# Patient Record
Sex: Female | Born: 1990 | Race: Black or African American | Hispanic: No | Marital: Single | Smoking: Never smoker
Health system: Southern US, Community
[De-identification: ages and names within clinical notes are randomized; demographics above are authoritative.]

## PROBLEM LIST (undated history)

## (undated) DIAGNOSIS — N159 Renal tubulo-interstitial disease, unspecified: Secondary | ICD-10-CM

## (undated) DIAGNOSIS — Z331 Pregnant state, incidental: Secondary | ICD-10-CM

## (undated) DIAGNOSIS — IMO0001 Reserved for inherently not codable concepts without codable children: Secondary | ICD-10-CM

## (undated) HISTORY — PX: OTHER SURGICAL HISTORY: SHX169

---
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
## 2011-05-27 ENCOUNTER — Encounter (HOSPITAL_COMMUNITY): Payer: Self-pay

## 2011-05-27 ENCOUNTER — Emergency Department (HOSPITAL_COMMUNITY): Payer: Medicaid Other

## 2011-05-27 ENCOUNTER — Emergency Department (HOSPITAL_COMMUNITY)
Admission: EM | Admit: 2011-05-27 | Discharge: 2011-05-27 | Disposition: A | Discharge status: Home / Self Care | Payer: Medicaid Other | Attending: Emergency Medicine | Admitting: Emergency Medicine

## 2011-05-27 DIAGNOSIS — O9989 Other specified diseases and conditions complicating pregnancy, childbirth and the puerperium: Secondary | ICD-10-CM | POA: Insufficient documentation

## 2011-05-27 DIAGNOSIS — R1011 Right upper quadrant pain: Secondary | ICD-10-CM | POA: Insufficient documentation

## 2011-05-27 HISTORY — DX: Reserved for inherently not codable concepts without codable children: IMO0001

## 2011-05-27 NOTE — ED Notes (Signed)
No new rx, pt voiced understanding to f/u with OB/GYN and return for any worsening abdominal pain/bleeding.  No c/o pain/vaginal bleeding at discharge
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
## 2011-05-27 NOTE — ED Notes (Signed)
Report called to [place], RN and pt moved to CDU 7.

## 2011-05-27 NOTE — ED Notes (Signed)
Pelvic exam has been completed.  Awaiting all results for pt disposition.
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
## 2011-05-27 NOTE — ED Notes (Signed)
[place] tech called to inform that she was here and ready for the pt.  Pt sent to [place].

## 2011-05-27 NOTE — ED Notes (Signed)
Pt transported to [place]
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
## 2011-05-27 NOTE — ED Notes (Addendum)
MD at bedside. 

## 2011-05-27 NOTE — ED Provider Notes (Signed)
Medical screening examination/treatment/procedure(s) were performed by non-physician practitioner and as supervising physician I was immediately available for consultation/collaboration.   Dayton Bailiff, MD 05/27/11 2149

## 2011-05-27 NOTE — ED Notes (Signed)
Pt returned from [place].  Family at bedside. No verbal complaints at this time.  Awaiting all diagnostic results for disposition.

## 2011-06-10 ENCOUNTER — Emergency Department (HOSPITAL_COMMUNITY)
Admission: EM | Admit: 2011-06-10 | Discharge: 2011-06-10 | Disposition: A | Discharge status: Home / Self Care | Payer: Medicaid Other | Attending: Emergency Medicine | Admitting: Emergency Medicine

## 2011-06-10 ENCOUNTER — Other Ambulatory Visit (HOSPITAL_COMMUNITY): Payer: Medicaid Other

## 2011-06-10 DIAGNOSIS — R1011 Right upper quadrant pain: Secondary | ICD-10-CM | POA: Insufficient documentation

## 2011-06-10 DIAGNOSIS — R112 Nausea with vomiting, unspecified: Secondary | ICD-10-CM | POA: Insufficient documentation

## 2011-06-10 DIAGNOSIS — R000 Tachycardia, unspecified: Secondary | ICD-10-CM | POA: Insufficient documentation

## 2011-06-10 DIAGNOSIS — M545 Low back pain, unspecified: Secondary | ICD-10-CM | POA: Insufficient documentation

## 2011-06-10 DIAGNOSIS — R10811 Right upper quadrant abdominal tenderness: Secondary | ICD-10-CM | POA: Insufficient documentation

## 2011-06-10 LAB — URINALYSIS, ROUTINE W REFLEX MICROSCOPIC
Bilirubin Urine: NEGATIVE
Glucose, UA: NEGATIVE mg/dL
Hgb urine dipstick: NEGATIVE
Leukocytes, UA: NEGATIVE
Nitrite: NEGATIVE
Protein, ur: NEGATIVE mg/dL
Specific Gravity, Urine: 1.015 (ref 1.005–1.030)
Urobilinogen, UA: 0.2 mg/dL (ref 0.0–1.0)
pH: 6 (ref 5.0–8.0)

## 2011-06-10 LAB — COMPREHENSIVE METABOLIC PANEL
ALT: 9 U/L (ref 0–35)
AST: 16 U/L (ref 0–37)
Albumin: 3.2 g/dL — ABNORMAL LOW (ref 3.5–5.2)
Alkaline Phosphatase: 93 U/L (ref 39–117)
BUN: 5 mg/dL — ABNORMAL LOW (ref 6–23)
CO2: 21 mEq/L (ref 19–32)
Calcium: 9.5 mg/dL (ref 8.4–10.5)
Chloride: 100 mEq/L (ref 96–112)
Creatinine, Ser: 0.51 mg/dL (ref 0.50–1.10)
GFR calc Af Amer: >90 mL/min (ref >90)
GFR calc non Af Amer: >90 mL/min (ref >90)
Glucose, Bld: 105 mg/dL — ABNORMAL HIGH (ref 70–99)
Potassium: 3.3 mEq/L — ABNORMAL LOW (ref 3.5–5.1)
Sodium: 132 mEq/L — ABNORMAL LOW (ref 135–145)
Total Bilirubin: 0.3 mg/dL (ref 0.3–1.2)
Total Protein: 7.2 g/dL (ref 6.0–8.3)

## 2011-06-10 LAB — DIFFERENTIAL
Basophils Absolute: 0 10*3/uL (ref 0.0–0.1)
Basophils Relative: 0 % (ref 0–1)
Eosinophils Absolute: 0 10*3/uL (ref 0.0–0.7)
Eosinophils Relative: 0 % (ref 0–5)
Lymphocytes Relative: 8 % — ABNORMAL LOW (ref 12–46)
Lymphs Abs: 0.8 10*3/uL (ref 0.7–4.0)
Monocytes Absolute: 0.4 10*3/uL (ref 0.1–1.0)
Monocytes Relative: 3 % (ref 3–12)
Neutro Abs: 9.3 10*3/uL — ABNORMAL HIGH (ref 1.7–7.7)
Neutrophils Relative %: 89 % — ABNORMAL HIGH (ref 43–77)

## 2011-06-10 LAB — CBC
HCT: 33.7 % — ABNORMAL LOW (ref 36.0–46.0)
Hemoglobin: 11.3 g/dL — ABNORMAL LOW (ref 12.0–15.0)
MCH: 28.3 pg (ref 26.0–34.0)
MCHC: 33.5 g/dL (ref 30.0–36.0)
MCV: 84.5 fL (ref 78.0–100.0)
Platelets: 218 10*3/uL (ref 150–400)
RBC: 3.99 MIL/uL (ref 3.87–5.11)
RDW: 13 % (ref 11.5–15.5)
WBC: 10.5 10*3/uL (ref 4.0–10.5)

## 2011-06-10 LAB — LIPASE, BLOOD: Lipase: 18 U/L (ref 11–59)

## 2011-06-10 MED ORDER — SODIUM CHLORIDE 0.9 % IV BOLUS (SEPSIS)
1000.0000 mL | INTRAVENOUS | Status: AC
  Administered 2011-06-10: 1000 mL via INTRAVENOUS

## 2011-06-10 MED ORDER — HYDROMORPHONE HCL PF 1 MG/ML IJ SOLN
1.0000 mg | Freq: Once | INTRAMUSCULAR | Status: AC
  Administered 2011-06-10: 1 mg via INTRAVENOUS
  Filled 2011-06-10: qty 1

## 2011-06-10 MED ORDER — ONDANSETRON HCL 4 MG/2ML IJ SOLN
4.0000 mg | Freq: Once | INTRAMUSCULAR | Status: AC
  Administered 2011-06-10: 4 mg via INTRAVENOUS
  Filled 2011-06-10: qty 2

## 2011-06-10 NOTE — ED Notes (Signed)
Pt states is [redacted] weeks pregnant, onset of lower back pain that radiates upward and into the right upper back x 2 days, denies injury

## 2011-06-10 NOTE — ED Provider Notes (Signed)
History     CSN: [id number]  Arrival date & time 06/10/11  0038   First MD Initiated Contact with Patient 06/10/11 0123      Chief Complaint  Patient presents with  . Back Pain    (Consider location/radiation/quality/duration/timing/severity/associated sxs/prior treatment) HPI Comments: Patient states that over the last 48 hours she developed a right upper quadrant and right back pain. She has been able to eating and drink but tonight started to develop nausea and vomiting which has been ongoing for the last 12 hours. Her pain is becoming worse, she has never had abdominal surgery, has never had evaluation for gallstones. She is [redacted] weeks pregnant with her second pregnancy. Her first pregnancy ended in miscarriage.  Patient is a 21 y.o. female presenting with back pain. The history is provided by the patient.  Back Pain  This is a new problem. The current episode started yesterday. The problem occurs constantly. The problem has been gradually worsening. The pain is associated with no known injury. Pain location: lower back bilaterally, right mid back and right upper quadrant. The quality of the pain is described as stabbing and aching. Radiates to: right abdomen. The pain is at a severity of 10/10. The pain is severe. Exacerbated by: palpation. The pain is the same all the time. Associated symptoms include abdominal pain. Pertinent negatives include no chest pain, no fever, no numbness, no headaches, no abdominal swelling, no bowel incontinence, no perianal numbness, no bladder incontinence, no dysuria, no pelvic pain, no leg pain, no paresthesias, no tingling and no weakness. She has tried nothing for the symptoms.    Past Medical History  Diagnosis Date  . No significant past medical history     Past Surgical History  Procedure Date  . Dnc     No family history on file.  History  Substance Use Topics  . Smoking status: Not on file  . Smokeless tobacco: Not on file  . Alcohol  Use:     OB History    Grav Para Term Preterm Abortions TAB SAB Ect Mult Living   1               Review of Systems  Constitutional: Negative for fever.  Cardiovascular: Negative for chest pain.  Gastrointestinal: Positive for abdominal pain. Negative for bowel incontinence.  Genitourinary: Negative for bladder incontinence, dysuria and pelvic pain.  Musculoskeletal: Positive for back pain.  Neurological: Negative for tingling, weakness, numbness, headaches and paresthesias.  All other systems reviewed and are negative.    Allergies  Review of patient's allergies indicates no known allergies.  Home Medications   Current Outpatient Rx  Name Route Sig Dispense Refill  . ACETAMINOPHEN 500 MG PO TABS Oral Take 500 mg by mouth every 6 (six) hours as needed. For pain    . ONDANSETRON HCL 8 MG PO TABS Oral Take 8 mg by mouth every 8 (eight) hours as needed. For nausea    . PRENATAL MULTIVITAMIN CH Oral Take 1 tablet by mouth daily.    Marland Kitchen RANITIDINE HCL 150 MG PO CAPS Oral Take 150 mg by mouth 2 (two) times daily.      BP 120/68  Pulse 128  Temp(Src) 98.6 F (37 C) (Oral)  Resp 20  Ht 5\' 4"  (1.626 m)  Wt 144 lb 8 oz (65.545 kg)  BMI 24.80 kg/m2  SpO2 100%  Physical Exam  Nursing note and vitals reviewed. Constitutional: She appears well-developed and well-nourished. No distress.  HENT:  Head:  Normocephalic and atraumatic.  Mouth/Throat: Oropharynx is clear and moist. No oropharyngeal exudate.  Eyes: Conjunctivae and EOM are normal. Pupils are equal, round, and reactive to light. Right eye exhibits no discharge. Left eye exhibits no discharge. No scleral icterus.  Neck: Normal range of motion. Neck supple. No JVD present. No thyromegaly present.  Cardiovascular: Regular rhythm, normal heart sounds and intact distal pulses.  Exam reveals no gallop and no friction rub.   No murmur heard.      tachycardia  Pulmonary/Chest: Effort normal and breath sounds normal. No  respiratory distress. She has no wheezes. She has no rales.  Abdominal: Soft. Bowel sounds are normal. She exhibits no distension and no mass. There is tenderness ( right upper quadrant tenderness to palpation, no other abdominal tenderness, non-peritoneal, soft and no guarding).  Musculoskeletal: Normal range of motion. She exhibits tenderness ( mild tenderness in the lower back and right mid back). She exhibits no edema.  Lymphadenopathy:    She has no cervical adenopathy.  Neurological: She is alert. Coordination normal.  Skin: Skin is warm and dry. No rash noted. No erythema.  Psychiatric: She has a normal mood and affect. Her behavior is normal.    ED Course  Procedures (including critical care time)  Labs Reviewed  URINALYSIS, ROUTINE W REFLEX MICROSCOPIC - Abnormal; Notable for the following:    Ketones, ur TRACE (*)    All other components within normal limits  CBC - Abnormal; Notable for the following:    Hemoglobin 11.3 (*)    HCT 33.7 (*)    All other components within normal limits  DIFFERENTIAL - Abnormal; Notable for the following:    Neutrophils Relative 89 (*)    Neutro Abs 9.3 (*)    Lymphocytes Relative 8 (*)    All other components within normal limits  COMPREHENSIVE METABOLIC PANEL - Abnormal; Notable for the following:    Sodium 132 (*)    Potassium 3.3 (*)    Glucose, Bld 105 (*)    BUN 5 (*)    Albumin 3.2 (*)    All other components within normal limits  LIPASE, BLOOD  URINE CULTURE   No results found.   1. Abdominal pain       MDM  Exam consistent with a possible etiology including gallbladder disease, pyelonephritis or urinary source, pancreatitis. Laboratory evaluation to ensue, IV fluids, labs, parenteral medications.  Pt reexamined and has no abdominal tenderness on repeat exam. She states that the hydromorphone improved her pain completely.    Review of laboratory studies shows slight hyponatremia and hypokalemia, normal renal function,  normal liver function, normal blood counts and urinalysis clean of infection.  Patient informed of results and encouraged to followup in the morning for ultrasound which is scheduled for her.       Vida Roller, MD 06/10/11 [phone]

## 2011-06-10 NOTE — ED Notes (Signed)
Pt was advised to come for an outpatient abdominal ultrasound for lower right quadrant pain by 1200 pm February 10,2013. Pt did not show or call and cancel ultrasound.

## 2011-06-11 LAB — URINE CULTURE
Colony Count: 1000
Culture  Setup Time: 201302102101

## 2011-06-29 NOTE — ED Provider Notes (Signed)
Medical screening examination/treatment/procedure(s) were performed by non-physician practitioner and as supervising physician I was immediately available for consultation/collaboration.   Dayton Bailiff, MD 06/29/11 1500

## 2011-08-06 ENCOUNTER — Encounter (HOSPITAL_COMMUNITY): Payer: Self-pay | Admitting: *Deleted

## 2011-08-06 ENCOUNTER — Emergency Department (HOSPITAL_COMMUNITY)
Admission: EM | Admit: 2011-08-06 | Discharge: 2011-08-06 | Disposition: A | Discharge status: Home / Self Care | Payer: Medicaid Other | Attending: Emergency Medicine | Admitting: Emergency Medicine

## 2011-08-06 DIAGNOSIS — R1013 Epigastric pain: Secondary | ICD-10-CM | POA: Insufficient documentation

## 2011-08-06 DIAGNOSIS — R10816 Epigastric abdominal tenderness: Secondary | ICD-10-CM | POA: Insufficient documentation

## 2011-08-06 DIAGNOSIS — M549 Dorsalgia, unspecified: Secondary | ICD-10-CM | POA: Insufficient documentation

## 2011-08-06 DIAGNOSIS — Z331 Pregnant state, incidental: Secondary | ICD-10-CM | POA: Insufficient documentation

## 2011-08-06 DIAGNOSIS — E871 Hypo-osmolality and hyponatremia: Secondary | ICD-10-CM | POA: Insufficient documentation

## 2011-08-06 HISTORY — DX: Pregnant state, incidental: Z33.1

## 2011-08-06 LAB — COMPREHENSIVE METABOLIC PANEL
ALT: 13 U/L (ref 0–35)
AST: 20 U/L (ref 0–37)
Albumin: 3.4 g/dL — ABNORMAL LOW (ref 3.5–5.2)
Alkaline Phosphatase: 109 U/L (ref 39–117)
BUN: 9 mg/dL (ref 6–23)
CO2: 19 mEq/L (ref 19–32)
Calcium: 10.2 mg/dL (ref 8.4–10.5)
Chloride: 101 mEq/L (ref 96–112)
Creatinine, Ser: 0.51 mg/dL (ref 0.50–1.10)
GFR calc Af Amer: >90 mL/min (ref >90)
GFR calc non Af Amer: >90 mL/min (ref >90)
Glucose, Bld: 75 mg/dL (ref 70–99)
Potassium: 3.8 mEq/L (ref 3.5–5.1)
Sodium: 133 mEq/L — ABNORMAL LOW (ref 135–145)
Total Bilirubin: 0.1 mg/dL — ABNORMAL LOW (ref 0.3–1.2)
Total Protein: 7.5 g/dL (ref 6.0–8.3)

## 2011-08-06 LAB — DIFFERENTIAL
Basophils Absolute: 0 10*3/uL (ref 0.0–0.1)
Basophils Relative: 0 % (ref 0–1)
Eosinophils Absolute: 0.1 10*3/uL (ref 0.0–0.7)
Eosinophils Relative: 1 % (ref 0–5)
Lymphocytes Relative: 25 % (ref 12–46)
Lymphs Abs: 2.9 10*3/uL (ref 0.7–4.0)
Monocytes Absolute: 0.7 10*3/uL (ref 0.1–1.0)
Monocytes Relative: 6 % (ref 3–12)
Neutro Abs: 7.9 10*3/uL — ABNORMAL HIGH (ref 1.7–7.7)
Neutrophils Relative %: 68 % (ref 43–77)

## 2011-08-06 LAB — URINALYSIS, ROUTINE W REFLEX MICROSCOPIC
Bilirubin Urine: NEGATIVE
Glucose, UA: NEGATIVE mg/dL
Hgb urine dipstick: NEGATIVE
Ketones, ur: NEGATIVE mg/dL
Leukocytes, UA: NEGATIVE
Nitrite: NEGATIVE
Protein, ur: NEGATIVE mg/dL
Specific Gravity, Urine: >1.03 — ABNORMAL HIGH (ref 1.005–1.030)
Urobilinogen, UA: 0.2 mg/dL (ref 0.0–1.0)
pH: 5.5 (ref 5.0–8.0)

## 2011-08-06 LAB — CBC
HCT: 34.6 % — ABNORMAL LOW (ref 36.0–46.0)
Hemoglobin: 11.5 g/dL — ABNORMAL LOW (ref 12.0–15.0)
MCH: 29 pg (ref 26.0–34.0)
MCHC: 33.2 g/dL (ref 30.0–36.0)
MCV: 87.2 fL (ref 78.0–100.0)
Platelets: 278 10*3/uL (ref 150–400)
RBC: 3.97 MIL/uL (ref 3.87–5.11)
RDW: 13.6 % (ref 11.5–15.5)
WBC: 11.6 10*3/uL — ABNORMAL HIGH (ref 4.0–10.5)

## 2011-08-06 LAB — LIPASE, BLOOD: Lipase: 28 U/L (ref 11–59)

## 2011-08-06 MED ORDER — ACETAMINOPHEN 500 MG PO TABS
1000.0000 mg | ORAL_TABLET | Freq: Once | ORAL | Status: AC
  Administered 2011-08-06: 1000 mg via ORAL
  Filled 2011-08-06: qty 2

## 2011-08-06 MED ORDER — ALUM & MAG HYDROXIDE-SIMETH 200-200-20 MG/5ML PO SUSP
30.0000 mL | Freq: Once | ORAL | Status: AC
  Administered 2011-08-06: 30 mL via ORAL
  Filled 2011-08-06: qty 30

## 2011-08-06 MED ORDER — SODIUM CHLORIDE 0.9 % IV SOLN: INTRAVENOUS | Status: DC

## 2011-08-06 MED ORDER — SODIUM CHLORIDE 0.9 % IV BOLUS (SEPSIS)
1000.0000 mL | Freq: Once | INTRAVENOUS | Status: AC
  Administered 2011-08-06: 1000 mL via INTRAVENOUS

## 2011-08-06 NOTE — ED Provider Notes (Signed)
History  This chart was scribed for Ward Givens, MD by Bennett Scrape and Cherlynn Perches. This patient was seen in room APA01/APA01 and the patient's care was started at 7:05PM.  CSN: [id number]  Arrival date & time 08/06/11  1705   First MD Initiated Contact with Patient 08/06/11 1857      Chief Complaint  Patient presents with  . Back Pain     Patient is a 21 y.o. female presenting with back pain. The history is provided by the patient. No language interpreter was used.  Back Pain  This is a new problem. The current episode started 3 to 5 hours ago. The problem occurs constantly. The problem has not changed since onset.The pain is associated with no known injury. The pain is present in the lumbar spine. The quality of the pain is described as aching. The pain does not radiate. The pain is mild. The symptoms are aggravated by certain positions. The pain is the same all the time. Associated symptoms include abdominal pain (mild epigastric pain). Pertinent negatives include no chest pain and no dysuria. She has tried nothing for the symptoms. Risk factors include pregnancy.    Samantha Singleton is a 21 y.o. female who presents to the Emergency Department complaining of constant back pain diffusely throughout the lumbar region onset 3 hours. The pain is aching in quality and non-radiating. Pt initially said nothing made the pain worse including changing positions then states that moving in certain positions makes the pain worse and that nothing makes the pain better. Denies any change in activity or injury as the cause of the pain. Pt is pregnant and reports that her last normal menstrual period was February 11, 2011. She is followed by doctors at [facility] in [place] and is scheduled to give birth November 18, 2011 at [facility] Pt is [id number]. She states she is 0+. Pt has had previous complaints of back pain, last evaluation being in February but that was a sharp sticking pain.  Pt states she was not diagnosed with any condition at that time. Pt reports that she had emesis early in her pregnancy and has been taking Zofran and Zantac for relief.  Denies spotting, nausea, vomiting, and diarrhea as associated symptoms. Pt denies smoking and alcohol use.  OB Dr Ralph Dowdy and [facility].    Past Medical History  Diagnosis Date  . No significant past medical history   . Pregnant state, incidental     Past Surgical History  Procedure Date  . Dnc     History reviewed. No pertinent family history.  History  Substance Use Topics  . Smoking status: Never Smoker   . Smokeless tobacco: Not on file  . Alcohol Use: No  employed  OB History    Grav Para Term Preterm Abortions TAB SAB Ect Mult Living   1               Review of Systems  Respiratory: Negative for shortness of breath.   Cardiovascular: Negative for chest pain.  Gastrointestinal: Positive for abdominal pain (mild epigastric pain). Negative for nausea, vomiting and diarrhea.  Genitourinary: Negative for dysuria.  Musculoskeletal: Positive for back pain.  All other systems reviewed and are negative.    Allergies  Review of patient's allergies indicates no known allergies.  Home Medications   Current Outpatient Rx  Name Route Sig Dispense Refill  . ACETAMINOPHEN 500 MG PO TABS Oral Take 500 mg by mouth every 6 (six) hours  as needed. For pain    . ONDANSETRON HCL 8 MG PO TABS Oral Take 8 mg by mouth every 8 (eight) hours as needed. For nausea    . PRENATAL MULTIVITAMIN CH Oral Take 1 tablet by mouth daily.    Marland Kitchen RANITIDINE HCL 150 MG PO CAPS Oral Take 150 mg by mouth 2 (two) times daily.      Triage Vitals: BP 115/71  Pulse 132  Temp(Src) 98 F (36.7 C) (Oral)  Resp 22  Wt 150 lb (68.04 kg)  SpO2 100%  LMP 02/11/2011  Vital signs normal except tachycardia   Physical Exam  Nursing note and vitals reviewed. Constitutional: She is oriented to person, place, and time. She  appears well-developed and well-nourished. She appears distressed.  HENT:  Head: Normocephalic and atraumatic.  Right Ear: External ear normal.  Left Ear: External ear normal.       Dry mucous membranes  Eyes: Conjunctivae and EOM are normal. Pupils are equal, round, and reactive to light.  Neck: Normal range of motion. Neck supple.  Cardiovascular: Normal rate, regular rhythm and normal heart sounds.   Pulmonary/Chest: Effort normal and breath sounds normal.  Abdominal: Soft. She exhibits no distension. There is tenderness. There is no rebound and no guarding.       Abdomen is consistent with dates. Patient has some mild epigastric tenderness without guarding or rebound. She also has some minimal discomfort in the left upper and right upper quadrant also.  Genitourinary:       Patient has normal external genitalia. There is no blood in her vault. Nitrazine test was negative. Her cervix feels soft but is not elongated. Her cervix is closed.  Musculoskeletal: Normal range of motion. She exhibits no edema and no tenderness.       Patient has diffuse tenderness across her back cleaned both flanks.  Neurological: She is alert and oriented to person, place, and time.  Skin: Skin is warm and dry.  Psychiatric: She has a normal mood and affect. Her behavior is normal.    ED Course  Procedures (including critical care time)  7:20PM - Treatment plan discussed with patient and she agrees.  [facility] has reviewed her tocometer strips and states they do not see any evidence of contractions, FHR has been fine. Monitor shows HR 120-140 with variability  20:40Dr Kent Hjerpe states she is 25 weeks, 1day, given lab and monitor results. States to given antacid. States to come to the office in the am, states to have her take tylenol (has had +THC on their tests).  DIAGNOSTIC STUDIES: Oxygen Saturation is 100% on room air, normal by my interpretation.    COORDINATION OF CARE:  Results for  orders placed during the hospital encounter of 08/06/11  URINALYSIS, ROUTINE W REFLEX MICROSCOPIC      Component Value Range   Color, Urine YELLOW  YELLOW    APPearance CLEAR  CLEAR    Specific Gravity, Urine >1.030 (*) 1.005 - 1.030    pH 5.5  5.0 - 8.0    Glucose, UA NEGATIVE  NEGATIVE (mg/dL)   Hgb urine dipstick NEGATIVE  NEGATIVE    Bilirubin Urine NEGATIVE  NEGATIVE    Ketones, ur NEGATIVE  NEGATIVE (mg/dL)   Protein, ur NEGATIVE  NEGATIVE (mg/dL)   Urobilinogen, UA 0.2  0.0 - 1.0 (mg/dL)   Nitrite NEGATIVE  NEGATIVE    Leukocytes, UA NEGATIVE  NEGATIVE   CBC      Component Value Range   WBC  11.6 (*) 4.0 - 10.5 (K/uL)   RBC 3.97  3.87 - 5.11 (MIL/uL)   Hemoglobin 11.5 (*) 12.0 - 15.0 (g/dL)   HCT 40.9 (*) 81.1 - 46.0 (%)   MCV 87.2  78.0 - 100.0 (fL)   MCH 29.0  26.0 - 34.0 (pg)   MCHC 33.2  30.0 - 36.0 (g/dL)   RDW 91.4  78.2 - 95.6 (%)   Platelets 278  150 - 400 (K/uL)  DIFFERENTIAL      Component Value Range   Neutrophils Relative 68  43 - 77 (%)   Neutro Abs 7.9 (*) 1.7 - 7.7 (K/uL)   Lymphocytes Relative 25  12 - 46 (%)   Lymphs Abs 2.9  0.7 - 4.0 (K/uL)   Monocytes Relative 6  3 - 12 (%)   Monocytes Absolute 0.7  0.1 - 1.0 (K/uL)   Eosinophils Relative 1  0 - 5 (%)   Eosinophils Absolute 0.1  0.0 - 0.7 (K/uL)   Basophils Relative 0  0 - 1 (%)   Basophils Absolute 0.0  0.0 - 0.1 (K/uL)  COMPREHENSIVE METABOLIC PANEL      Component Value Range   Sodium 133 (*) 135 - 145 (mEq/L)   Potassium 3.8  3.5 - 5.1 (mEq/L)   Chloride 101  96 - 112 (mEq/L)   CO2 19  19 - 32 (mEq/L)   Glucose, Bld 75  70 - 99 (mg/dL)   BUN 9  6 - 23 (mg/dL)   Creatinine, Ser 2.13  0.50 - 1.10 (mg/dL)   Calcium 08.6  8.4 - 10.5 (mg/dL)   Total Protein 7.5  6.0 - 8.3 (g/dL)   Albumin 3.4 (*) 3.5 - 5.2 (g/dL)   AST 20  0 - 37 (U/L)   ALT 13  0 - 35 (U/L)   Alkaline Phosphatase 109  39 - 117 (U/L)   Total Bilirubin 0.1 (*) 0.3 - 1.2 (mg/dL)   GFR calc non Af Amer >90  >90 (mL/min)    GFR calc Af Amer >90  >90 (mL/min)  LIPASE, BLOOD      Component Value Range   Lipase 28  11 - 59 (U/L)   Laboratory interpretation all normal except concentrated urine, mild hyponatremia .    1. Back pain   2. Pregnancy as incidental finding     Plan discharge Devoria Albe, MD, FACEP   MDM     I personally performed the services described in this documentation, which was scribed in my presence. The recorded information has been reviewed and considered. Devoria Albe, MD, Armando Gang      Ward Givens, MD 08/06/11 2053

## 2011-08-06 NOTE — ED Notes (Addendum)
Low back pain , no vag bleeding,  Onset 1 hour pta. Pt has been feeling baby move.No abd pain

## 2011-08-06 NOTE — ED Notes (Signed)
MD notified that patient could be removed from [facility] after MD order, per Olegario Messier at [facility].  30 minute strip has been obtained.

## 2011-08-06 NOTE — Discharge Instructions (Signed)
Your monitor tonight does not show any evidence of you going into labor. Your babies heartbeat was normal Your blood tests for possible gallstones were normal. I spoke to your obstetrician who was on call tonight and he wants you to take  Tylenol for pain. He also wants you to come to the office in the morning to be  reseen by them.  Back Pain in Pregnancy Back pain during pregnancy is common. It happens in about half of all pregnancies. It is important for you and your baby that you remain active during your pregnancy.If you feel that back pain is not allowing you to remain active or sleep well, it is time to see your caregiver. Back pain may be caused by several factors related to changes during your pregnancy.Fortunately, unless you had trouble with your back before your pregnancy, the pain is likely to get better after you deliver. Low back pain usually occurs between the fifth and seventh months of pregnancy. It can, however, happen in the first couple months. Factors that increase the risk of back problems include:   Previous back problems.   Injury to your back.   Having twins or multiple births.   A chronic cough.   Stress.   Job-related repetitive motions.   Muscle or spinal disease in the back.   Family history of back problems, ruptured (herniated) discs, or osteoporosis.   Depression, anxiety, and panic attacks.  CAUSES   When you are pregnant, your body produces a hormone called relaxin. This hormonemakes the ligaments connecting the low back and pubic bones more flexible. This flexibility allows the baby to be delivered more easily. When your ligaments are loose, your muscles need to work harder to support your back. Soreness in your back can come from tired muscles. Soreness can also come from back tissues that are irritated since they are receiving less support.   As the baby grows, it puts pressure on the nerves and blood vessels in your pelvis. This can cause back  pain.   As the baby grows and gets heavier during pregnancy, the uterus pushes the stomach muscles forward and changes your center of gravity. This makes your back muscles work harder to maintain good posture.  SYMPTOMS  Lumbar pain during pregnancy Lumbar pain during pregnancy usually occurs at or above the waist in the center of the back. There may be pain and numbness that radiates into your leg or foot. This is similar to low back pain experienced by non-pregnant women. It usually increases with sitting for long periods of time, standing, or repetitive lifting. Tenderness may also be present in the muscles along your upper back. Posterior pelvic pain during pregnancy Pain in the back of the pelvis is more common than lumbar pain in pregnancy. It is a deep pain felt in your side at the waistline, or across the tailbone (sacrum), or in both places. You may have pain on one or both sides. This pain can also go into the buttocks and backs of the upper thighs. Pubic and groin pain may also be present. The pain does not quickly resolve with rest, and morning stiffness may also be present. Pelvic pain during pregnancy can be brought on by most activities. A high level of fitness before and during pregnancy may or may not prevent this problem. Labor pain is usually 1 to 2 minutes apart, lasts for about 1 minute, and involves a bearing down feeling or pressure in your pelvis. However, if you are at term  with the pregnancy, constant low back pain can be the beginning of early labor, and you should be aware of this. DIAGNOSIS  X-rays of the back should not be done during the first 12 to 14 weeks of the pregnancy and only when absolutely necessary during the rest of the pregnancy. MRIs do not give off radiation and are safe during pregnancy. MRIs also should only be done when absolutely necessary. HOME CARE INSTRUCTIONS  Exercise as directed by your caregiver. Exercise is the most effective way to prevent or  manage back pain. If you have a back problem, it is especially important to avoid sports that require sudden body movements. Swimming and walking are great activities.   Do not stand in one place for long periods of time.   Do not wear high heels.   Sit in chairs with good posture. Use a pillow on your lower back if necessary. Make sure your head rests over your shoulders and is not hanging forward.   Try sleeping on your side, preferably the left side, with a pillow or two between your legs. If you are sore after a night's rest, your bedmay betoo soft.Try placing a board between your mattress and box spring.   Listen to your body when lifting.If you are experiencing pain, ask for help or try bending yourknees more so you can use your leg muscles rather than your back muscles. Squat down when picking up something from the floor. Do not bend over.   Eat a healthy diet. Try to gain weight within your caregiver's recommendations.   Use heat or cold packs 3 to 4 times a day for 15 minutes to help with the pain.   Only take over-the-counter or prescription medicines for pain, discomfort, or fever as directed by your caregiver.  Sudden (acute) back pain  Use bed rest for only the most extreme, acute episodes of back pain. Prolonged bed rest over 48 hours will aggravate your condition.   Ice is very effective for acute conditions.   Put ice in a plastic bag.   Place a towel between your skin and the bag.   Leave the ice on for 10 to 20 minutes every 2 hours, or as needed.   Using heat packs for 30 minutes prior to activities is also helpful.  Continued back pain See your caregiver if you have continued problems. Your caregiver can help or refer you for appropriate physical therapy. With conditioning, most back problems can be avoided. Sometimes, a more serious issue may be the cause of back pain. You should be seen right away if new problems seem to be developing. Your caregiver may  recommend:  A maternity girdle.   An elastic sling.   A back brace.   A massage therapist or acupuncture.  SEEK MEDICAL CARE IF:   You are not able to do most of your daily activities, even when taking the pain medicine you were given.   You need a referral to a physical therapist or chiropractor.   You want to try acupuncture.  SEEK IMMEDIATE MEDICAL CARE IF:  You develop numbness, tingling, weakness, or problems with the use of your arms or legs.   You develop severe back pain that is no longer relieved with medicines.   You have a sudden change in bowel or bladder control.   You have increasing pain in other areas of the body.   You develop shortness of breath, dizziness, or fainting.   You develop nausea, vomiting, or  sweating.   You have back pain which is similar to labor pains.   You have back pain along with your water breaking or vaginal bleeding.   You have back pain or numbness that travels down your leg.   Your back pain developed after you fell.   You develop pain on one side of your back. You may have a kidney stone.   You see blood in your urine. You may have a bladder infection or kidney stone.   You have back pain with blisters. You may have shingles.  Back pain is fairly common during pregnancy but should not be accepted as just part of the process. Back pain should always be treated as soon as possible. This will make your pregnancy as pleasant as possible. Document Released: 07/25/2005 Document Revised: 04/05/2011 Document Reviewed: 09/05/2010 [facility] Patient Information 2012 [place], [place].

## 2011-08-06 NOTE — Progress Notes (Signed)
Spoke with Samantha Singleton at length regarding the EFM tracing poorly. Explained that sometimes it is necessary to hold monitor on in order to obtain a 20 minute tracing.  Samantha Singleton explained that no one is available to provide this level of care at this time.  Samantha Singleton states she will speak with ED physician regarding this situation.  Will continue to attempt to monitor remotely.

## 2011-08-06 NOTE — ED Notes (Signed)
Spoke with joy with rapid response. Pt placed on toco

## 2011-08-06 NOTE — Progress Notes (Signed)
Notified ED RN of reactive tracing (120 bpm, 10x10 accels, no decels) and no contractions noted.  Will continue to assess monitoring while patient is on fetal monitoring

## 2013-04-16 ENCOUNTER — Encounter (HOSPITAL_COMMUNITY): Payer: Self-pay | Admitting: Emergency Medicine

## 2013-04-16 ENCOUNTER — Emergency Department (HOSPITAL_COMMUNITY)
Admission: EM | Admit: 2013-04-16 | Discharge: 2013-04-16 | Disposition: A | Discharge status: Home / Self Care | Payer: Medicaid Other | Attending: Emergency Medicine | Admitting: Emergency Medicine

## 2013-04-16 ENCOUNTER — Emergency Department (HOSPITAL_COMMUNITY): Payer: Medicaid Other

## 2013-04-16 DIAGNOSIS — Z87448 Personal history of other diseases of urinary system: Secondary | ICD-10-CM | POA: Insufficient documentation

## 2013-04-16 DIAGNOSIS — R112 Nausea with vomiting, unspecified: Secondary | ICD-10-CM | POA: Insufficient documentation

## 2013-04-16 DIAGNOSIS — F0781 Postconcussional syndrome: Secondary | ICD-10-CM

## 2013-04-16 HISTORY — DX: Renal tubulo-interstitial disease, unspecified: N15.9

## 2013-04-16 MED ORDER — OXYCODONE-ACETAMINOPHEN 5-325 MG PO TABS: 2.0000 | ORAL_TABLET | ORAL | Status: DC | PRN
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
## 2014-03-01 ENCOUNTER — Encounter (HOSPITAL_COMMUNITY): Payer: Self-pay | Admitting: Emergency Medicine

## 2016-08-15 ENCOUNTER — Emergency Department (HOSPITAL_COMMUNITY): Payer: No Typology Code available for payment source

## 2016-08-15 ENCOUNTER — Encounter (HOSPITAL_COMMUNITY): Payer: Self-pay | Admitting: *Deleted

## 2016-08-15 ENCOUNTER — Emergency Department (HOSPITAL_COMMUNITY)
Admission: EM | Admit: 2016-08-15 | Discharge: 2016-08-15 | Disposition: A | Discharge status: Home / Self Care | Payer: No Typology Code available for payment source | Attending: Emergency Medicine | Admitting: Emergency Medicine

## 2016-08-15 DIAGNOSIS — T148XXA Other injury of unspecified body region, initial encounter: Secondary | ICD-10-CM

## 2016-08-15 DIAGNOSIS — S0003XA Contusion of scalp, initial encounter: Secondary | ICD-10-CM | POA: Diagnosis not present

## 2016-08-15 DIAGNOSIS — Y92410 Unspecified street and highway as the place of occurrence of the external cause: Secondary | ICD-10-CM | POA: Insufficient documentation

## 2016-08-15 DIAGNOSIS — Y9389 Activity, other specified: Secondary | ICD-10-CM | POA: Diagnosis not present

## 2016-08-15 DIAGNOSIS — S161XXA Strain of muscle, fascia and tendon at neck level, initial encounter: Secondary | ICD-10-CM | POA: Diagnosis not present

## 2016-08-15 DIAGNOSIS — S0990XA Unspecified injury of head, initial encounter: Secondary | ICD-10-CM | POA: Diagnosis present

## 2016-08-15 DIAGNOSIS — S39012A Strain of muscle, fascia and tendon of lower back, initial encounter: Secondary | ICD-10-CM | POA: Insufficient documentation

## 2016-08-15 DIAGNOSIS — Y999 Unspecified external cause status: Secondary | ICD-10-CM | POA: Diagnosis not present

## 2016-08-15 LAB — I-STAT BETA HCG BLOOD, ED (MC, WL, AP ONLY)

## 2016-08-15 MED ORDER — CYCLOBENZAPRINE HCL 10 MG PO TABS: 10.0000 mg | ORAL_TABLET | Freq: Two times a day (BID) | ORAL | 0 refills | Status: DC | PRN

## 2016-08-15 MED ORDER — CYCLOBENZAPRINE HCL 10 MG PO TABS
10.0000 mg | ORAL_TABLET | Freq: Once | ORAL | Status: AC
  Administered 2016-08-15: 10 mg via ORAL
  Filled 2016-08-15: qty 1

## 2016-08-15 MED ORDER — IBUPROFEN 800 MG PO TABS: 800.0000 mg | ORAL_TABLET | Freq: Three times a day (TID) | ORAL | 0 refills | Status: DC

## 2016-08-15 MED ORDER — KETOROLAC TROMETHAMINE 30 MG/ML IJ SOLN
30.0000 mg | Freq: Once | INTRAMUSCULAR | Status: AC
  Administered 2016-08-15: 30 mg via INTRAVENOUS
  Filled 2016-08-15: qty 1
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
## 2016-08-15 NOTE — Discharge Instructions (Signed)
You will feel sore for the next few days.   Take ibuprofen as directed for pain.   Take Flexeril as directed for pain. Do not drive while taking this medication because it will make you drowsy.   Follow-up with one of the clinics listed below for follow-up in 24-48 hours.   Return to the Emergency Dept for any worsening pain, weakness of your arms or legs, high fevers, or any other worsening or concerning symptoms.
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
## 2019-10-02 ENCOUNTER — Emergency Department (HOSPITAL_COMMUNITY)
Admission: EM | Admit: 2019-10-02 | Discharge: 2019-10-02 | Disposition: A | Discharge status: Home / Self Care | Payer: Self-pay | Attending: Emergency Medicine | Admitting: Emergency Medicine

## 2019-10-02 ENCOUNTER — Other Ambulatory Visit: Payer: Self-pay

## 2019-10-02 ENCOUNTER — Encounter (HOSPITAL_COMMUNITY): Payer: Self-pay | Admitting: *Deleted

## 2019-10-02 DIAGNOSIS — L02214 Cutaneous abscess of groin: Secondary | ICD-10-CM | POA: Insufficient documentation

## 2019-10-02 MED ORDER — OXYCODONE-ACETAMINOPHEN 5-325 MG PO TABS
1.0000 | ORAL_TABLET | Freq: Once | ORAL | Status: AC
  Administered 2019-10-02: 1 via ORAL
  Filled 2019-10-02: qty 1

## 2019-10-02 MED ORDER — HYDROCODONE-ACETAMINOPHEN 5-325 MG PO TABS: ORAL_TABLET | ORAL | 0 refills | Status: DC

## 2019-10-02 MED ORDER — LIDOCAINE HCL (PF) 2 % IJ SOLN
INTRAMUSCULAR | Status: AC
  Filled 2019-10-02: qty 10

## 2019-10-02 MED ORDER — LIDOCAINE HCL (PF) 2 % IJ SOLN
5.0000 mL | Freq: Once | INTRAMUSCULAR | Status: AC
  Administered 2019-10-02: 5 mL via INTRADERMAL

## 2019-10-02 MED ORDER — POVIDONE-IODINE 10 % EX SOLN
CUTANEOUS | Status: DC | PRN
  Administered 2019-10-02: 1 via TOPICAL
  Filled 2019-10-02: qty 15

## 2019-10-02 MED ORDER — CLINDAMYCIN HCL 150 MG PO CAPS: 300.0000 mg | ORAL_CAPSULE | Freq: Four times a day (QID) | ORAL | 0 refills | Status: DC

## 2019-10-02 NOTE — Discharge Instructions (Signed)
Apply warm wet compresses or warm water soaks to the affected area 2-3 times a day.  Take the antibiotic as directed until its finished.  The packing may be removed in 2 days.  Keep the area bandaged.  Contact the general surgeon listed to arrange a follow-up appointment.

## 2019-10-02 NOTE — ED Provider Notes (Signed)
[facility] EMERGENCY DEPARTMENT Provider Note   CSN: [id number] Arrival date & time: 10/02/19  1139     History Chief Complaint  Patient presents with  . Abscess    Samantha Singleton is a 29 y.o. female.  HPI     Samantha Singleton is a 29 y.o. female who presents to the Emergency Department complaining of pain, redness, and swelling to her right groin.  Symptoms have been waxing and waning for several weeks to months, worse for 3 to 4 days.  She was seen at another facility and given prescription for doxycycline.  She states that she has completed 2 courses of this medication without improvement.  She states that the area will "bust" and draining yellow pus but then returned shortly after.  She complains of pain with palpation and with walking.  Nothing makes her symptoms better.  She has tried over-the-counter pain relievers without improvement.  She denies urine or bowel changes, abdominal pain, fever, chills, nausea or vomiting.  She does admit to shaving her genital area and has been told in the past this may be the cause of her recurrent boil.  Past Medical History:  Diagnosis Date  . Kidney infection   . No significant past medical history   . Pregnant state, incidental     There are no problems to display for this patient.   Past Surgical History:  Procedure Laterality Date  . DNC       OB History    Gravida  1   Para      Term      Preterm      AB      Living        SAB      TAB      Ectopic      Multiple      Live Births              No family history on file.  Social History   Tobacco Use  . Smoking status: Never Smoker  . Smokeless tobacco: Never Used  Substance Use Topics  . Alcohol use: No  . Drug use: No    Home Medications Prior to Admission medications   Medication Sig Start Date End Date Taking? Authorizing Provider  cyclobenzaprine (FLEXERIL) 10 MG tablet Take 1 tablet (10 mg total) by mouth 2 (two) times daily as needed for  muscle spasms. 08/15/16   Maxwell Caul, PA-C  ibuprofen (ADVIL,MOTRIN) 800 MG tablet Take 1 tablet (800 mg total) by mouth 3 (three) times daily. 08/15/16   Maxwell Caul, PA-C    Allergies    Patient has no known allergies.  Review of Systems   Review of Systems  Constitutional: Negative for chills and fever.  Gastrointestinal: Negative for abdominal pain, nausea and vomiting.  Genitourinary: Negative for difficulty urinating, dysuria, menstrual problem, vaginal bleeding, vaginal discharge and vaginal pain.  Musculoskeletal: Negative for arthralgias and joint swelling.  Skin: Positive for color change.       Abscess right groin  Neurological: Negative for weakness and numbness.  Hematological: Negative for adenopathy.    Physical Exam Updated Vital Signs BP 106/67 (BP Location: Right Arm)   Pulse 92   Temp 98.3 F (36.8 C) (Oral)   Resp 16   Ht 5\' 4"  (1.626 m)   Wt 72.6 kg   SpO2 99%   BMI 27.46 kg/m   Physical Exam Vitals and nursing note reviewed.  Constitutional:  General: She is not in acute distress.    Appearance: Normal appearance. She is well-developed. She is not ill-appearing.  HENT:     Mouth/Throat:     Mouth: Mucous membranes are moist.  Cardiovascular:     Rate and Rhythm: Normal rate and regular rhythm.     Pulses: Normal pulses.  Pulmonary:     Effort: Pulmonary effort is normal. No respiratory distress.     Breath sounds: Normal breath sounds.  Abdominal:     General: There is no distension.     Palpations: Abdomen is soft.     Tenderness: There is no abdominal tenderness.  Skin:    General: Skin is warm.     Findings: Erythema present.     Comments: 4 cm area of fluctuance to the right groin.  Mild to moderate surrounding erythema.  No drainage at present.  No edema or erythema of the vulva or right labia.  Neurological:     General: No focal deficit present.     Mental Status: She is alert.     Sensory: No sensory deficit.      Motor: No weakness or abnormal muscle tone.     ED Results / Procedures / Treatments   Labs (all labs ordered are listed, but only abnormal results are displayed) Labs Reviewed - No data to display  EKG None  Radiology No results found.  Procedures Procedures (including critical care time)  Medications Ordered in ED Medications  lidocaine HCl (PF) (XYLOCAINE) 2 % injection 5 mL (has no administration in time range)  povidone-iodine (BETADINE) 10 % external solution (1 application Topical Given 10/02/19 1440)  oxyCODONE-acetaminophen (PERCOCET/ROXICET) 5-325 MG per tablet 1 tablet (has no administration in time range)  lidocaine HCl (PF) (XYLOCAINE) 2 % injection (has no administration in time range)   INCISION AND DRAINAGE Performed by: Kielyn Kardell Consent: Verbal consent obtained. Risks and benefits: risks, benefits and alternatives were discussed Type: abscess  Body area: right groin  Anesthesia: local infiltration  Incision was made with a #11 scalpel.  Local anesthetic: lidocaine 2 % w/o epinephrine  Anesthetic total: 3 ml  Complexity: complex Blunt dissection to break up loculations  Drainage: purulent  Drainage amount: moderate  Packing material: 1/4 in iodoform gauze  Patient tolerance: Patient tolerated the procedure well with no immediate complications.    ED Course  I have reviewed the triage vital signs and the nursing notes.  Pertinent labs & imaging results that were available during my care of the patient were reviewed by me and considered in my medical decision making (see chart for details).    MDM Rules/Calculators/A&P                      Patient here with recurrent boil of the right groin.  Per history, has completed a course of doxycycline without improvement.  She is here requesting I&D.  Successful I&D.  There is moderate amount of induration and abscess appears deep.  I recommended patient follow-up with general surgery.  We  will start antibiotics, she agrees to warm compresses.  Return precautions discussed  POC pregnancy ordered to confirm that pt is not pregnant prior to abx, but pt refused, adamant that she is not pregnant.  Will prescribe clindamycin.  Patient agrees to warm compresses and packing removal in 2 days.  Return precautions discussed.  Final Clinical Impression(s) / ED Diagnoses Final diagnoses:  Abscess of right groin    Rx / DC Orders  ED Discharge Orders    None       Pauline Aus, PA-C 10/02/19 1600    Cathren Laine, MD 10/05/19 1410

## 2019-10-02 NOTE — ED Triage Notes (Signed)
Abscess in groin area x 3 days

## 2019-10-02 NOTE — ED Notes (Signed)
Pt seen at 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

## 2022-03-30 ENCOUNTER — Encounter: Payer: Self-pay | Admitting: Emergency Medicine

## 2022-03-30 ENCOUNTER — Ambulatory Visit
Admission: EM | Admit: 2022-03-30 | Discharge: 2022-03-30 | Disposition: A | Discharge status: Home / Self Care | Payer: Medicaid Other | Attending: Family Medicine | Admitting: Family Medicine

## 2022-03-30 DIAGNOSIS — R509 Fever, unspecified: Secondary | ICD-10-CM | POA: Diagnosis not present

## 2022-03-30 DIAGNOSIS — R059 Cough, unspecified: Secondary | ICD-10-CM | POA: Diagnosis present

## 2022-03-30 DIAGNOSIS — J069 Acute upper respiratory infection, unspecified: Secondary | ICD-10-CM | POA: Diagnosis not present

## 2022-03-30 DIAGNOSIS — Z1152 Encounter for screening for COVID-19: Secondary | ICD-10-CM | POA: Diagnosis not present

## 2022-03-30 DIAGNOSIS — J101 Influenza due to other identified influenza virus with other respiratory manifestations: Secondary | ICD-10-CM | POA: Insufficient documentation

## 2022-03-30 DIAGNOSIS — R0981 Nasal congestion: Secondary | ICD-10-CM | POA: Diagnosis present

## 2022-03-30 LAB — RESP PANEL BY RT-PCR (FLU A&B, COVID) ARPGX2
Influenza A by PCR: NEGATIVE
Influenza B by PCR: POSITIVE — AB
SARS Coronavirus 2 by RT PCR: NEGATIVE

## 2022-03-30 MED ORDER — PROMETHAZINE-DM 6.25-15 MG/5ML PO SYRP: 5.0000 mL | ORAL_SOLUTION | Freq: Four times a day (QID) | ORAL | 0 refills | Status: DC | PRN

## 2022-03-30 NOTE — ED Triage Notes (Signed)
Nasal congestion, cough, fever, chills, since yesterday.

## 2022-03-30 NOTE — ED Provider Notes (Signed)
[facility]    CSN: [id number] Arrival date & time: 03/30/22  1329      History   Chief Complaint No chief complaint on file.   HPI Samantha Singleton is a 30 y.o. female.   Presenting today with 1 day history of nasal congestion, cough, fever, chills, body aches, sore throat.  Denies chest pain, shortness of breath, abdominal pain, nausea vomiting or diarrhea.  So far trying DayQuil, Tylenol with minimal relief.  Son sick with similar symptoms.    Past Medical History:  Diagnosis Date   Kidney infection    No significant past medical history    Pregnant state, incidental     There are no problems to display for this patient.   Past Surgical History:  Procedure Laterality Date   DNC      OB History     Gravida  1   Para      Term      Preterm      AB      Living         SAB      IAB      Ectopic      Multiple      Live Births               Home Medications    Prior to Admission medications   Medication Sig Start Date End Date Taking? Authorizing Provider  promethazine-dextromethorphan (PROMETHAZINE-DM) 6.25-15 MG/5ML syrup Take 5 mLs by mouth 4 (four) times daily as needed. 03/30/22  Yes Particia Nearing, PA-C  clindamycin (CLEOCIN) 150 MG capsule Take 2 capsules (300 mg total) by mouth 4 (four) times daily. 10/02/19   Triplett, Tammy, PA-C  cyclobenzaprine (FLEXERIL) 10 MG tablet Take 1 tablet (10 mg total) by mouth 2 (two) times daily as needed for muscle spasms. 08/15/16   Maxwell Caul, PA-C  HYDROcodone-acetaminophen (NORCO/VICODIN) 5-325 MG tablet Take one tab po q 4 hrs prn pain 10/02/19   Triplett, Tammy, PA-C  ibuprofen (ADVIL,MOTRIN) 800 MG tablet Take 1 tablet (800 mg total) by mouth 3 (three) times daily. 08/15/16   Maxwell Caul, PA-C    Family History History reviewed. No pertinent family history.  Social History Social History   Tobacco Use   Smoking status: Never   Smokeless tobacco: Never  Vaping  Use   Vaping Use: Every day  Substance Use Topics   Alcohol use: No   Drug use: No     Allergies   Patient has no known allergies.   Review of Systems Review of Systems Per HPI  Physical Exam Triage Vital Signs ED Triage Vitals  Enc Vitals Group     BP 03/30/22 1336 114/72     Pulse Rate 03/30/22 1336 91     Resp 03/30/22 1336 18     Temp 03/30/22 1336 98.6 F (37 C)     Temp Source 03/30/22 1336 Oral     SpO2 03/30/22 1336 92 %     Weight --      Height --      Head Circumference --      Peak Flow --      Pain Score 03/30/22 1339 0     Pain Loc --      Pain Edu? --      Excl. in GC? --    No data found.  Updated Vital Signs BP 114/72 (BP Location: Right Arm)   Pulse 91   Temp  98.6 F (37 C) (Oral)   Resp 18   LMP 03/22/2022 (Exact Date)   SpO2 92%   Visual Acuity Right Eye Distance:   Left Eye Distance:   Bilateral Distance:    Right Eye Near:   Left Eye Near:    Bilateral Near:     Physical Exam Vitals and nursing note reviewed.  Constitutional:      Appearance: Normal appearance.  HENT:     Head: Atraumatic.     Right Ear: Tympanic membrane and external ear normal.     Left Ear: Tympanic membrane and external ear normal.     Nose: Rhinorrhea present.     Mouth/Throat:     Mouth: Mucous membranes are moist.     Pharynx: Posterior oropharyngeal erythema present.  Eyes:     Extraocular Movements: Extraocular movements intact.     Conjunctiva/sclera: Conjunctivae normal.  Cardiovascular:     Rate and Rhythm: Normal rate and regular rhythm.     Heart sounds: Normal heart sounds.  Pulmonary:     Effort: Pulmonary effort is normal.     Breath sounds: Normal breath sounds. No wheezing or rales.  Musculoskeletal:        General: Normal range of motion.     Cervical back: Normal range of motion and neck supple.  Skin:    General: Skin is warm and dry.  Neurological:     Mental Status: She is alert and oriented to person, place, and time.   Psychiatric:        Mood and Affect: Mood normal.        Thought Content: Thought content normal.      UC Treatments / Results  Labs (all labs ordered are listed, but only abnormal results are displayed) Labs Reviewed  RESP PANEL BY RT-PCR (FLU A&B, COVID) [id number]    EKG   Radiology No results found.  Procedures Procedures (including critical care time)  Medications Ordered in UC Medications - No data to display  Initial Impression / Assessment and Plan / UC Course  I have reviewed the triage vital signs and the nursing notes.  Pertinent labs & imaging results that were available during my care of the patient were reviewed by me and considered in my medical decision making (see chart for details).     Vitals and exam reassuring and suggestive of a viral upper respiratory infection.  Respiratory panel pending, treat with Phenergan DM, supportive over-the-counter medications and home care.  Return for worsening symptoms.  Final Clinical Impressions(s) / UC Diagnoses   Final diagnoses:  Viral URI with cough  Fever, unspecified   Discharge Instructions   None    ED Prescriptions     Medication Sig Dispense Auth. Provider   promethazine-dextromethorphan (PROMETHAZINE-DM) 6.25-15 MG/5ML syrup Take 5 mLs by mouth 4 (four) times daily as needed. 100 mL Particia Nearing, [place]      PDMP not reviewed this encounter.   Particia Nearing, [place] 03/30/22 1432

## 2022-03-31 ENCOUNTER — Telehealth: Payer: Self-pay | Admitting: Emergency Medicine

## 2022-03-31 MED ORDER — OSELTAMIVIR PHOSPHATE 75 MG PO CAPS: 75.0000 mg | ORAL_CAPSULE | Freq: Two times a day (BID) | ORAL | 0 refills | Status: AC

## 2022-03-31 NOTE — Telephone Encounter (Signed)
Pt called and reported positive flu result was seen on Mychart this am and inquired about medication. Consulted PA and reported to electronically prescribe Tamiflu. Pt aware and pharmacy verified.

## 2022-06-26 ENCOUNTER — Telehealth: Payer: Self-pay

## 2022-06-26 NOTE — Telephone Encounter (Signed)
Mychart msg sent

## 2022-07-12 ENCOUNTER — Encounter: Payer: Medicaid Other | Admitting: Family Medicine

## 2022-07-19 ENCOUNTER — Other Ambulatory Visit (HOSPITAL_COMMUNITY): Payer: Self-pay | Admitting: Family Medicine

## 2022-07-19 ENCOUNTER — Ambulatory Visit: Payer: Medicaid Other | Admitting: Family Medicine

## 2022-07-19 ENCOUNTER — Encounter: Payer: Self-pay | Admitting: Family Medicine

## 2022-07-19 VITALS — BP 108/75 | HR 85 | Ht 64.0 in | Wt 197.0 lb

## 2022-07-19 DIAGNOSIS — N644 Mastodynia: Secondary | ICD-10-CM

## 2022-07-19 DIAGNOSIS — N6452 Nipple discharge: Secondary | ICD-10-CM

## 2022-07-19 DIAGNOSIS — R7301 Impaired fasting glucose: Secondary | ICD-10-CM | POA: Diagnosis not present

## 2022-07-19 DIAGNOSIS — Z1159 Encounter for screening for other viral diseases: Secondary | ICD-10-CM | POA: Diagnosis not present

## 2022-07-19 DIAGNOSIS — E039 Hypothyroidism, unspecified: Secondary | ICD-10-CM | POA: Diagnosis not present

## 2022-07-19 DIAGNOSIS — Z114 Encounter for screening for human immunodeficiency virus [HIV]: Secondary | ICD-10-CM

## 2022-07-19 DIAGNOSIS — E785 Hyperlipidemia, unspecified: Secondary | ICD-10-CM

## 2022-07-19 NOTE — Assessment & Plan Note (Signed)
Hx of breast discharge in the past 6 months Left and right breast appear normal, no suspicious masses, no skin or nipple changes or axillary nodes. Ordered left breast ultrasound for further evaluation Advise patient to apply cold , wet cloth or ice pack to the affected breast , wear loose-fitting , cotton clothing, use fragrance-free lotions, soaps, and detergents to minimize irritation.

## 2022-07-19 NOTE — Patient Instructions (Signed)
It was pleasure meeting with you today. Please take medications as prescribed. Follow up with your primary health provider if any health concerns arises. If symptoms worsen please contact your primary care provider and/or visit the emergency department.  

## 2022-07-19 NOTE — Progress Notes (Signed)
New Patient Office Visit   Subjective   Patient ID: Samantha Singleton, female    DOB: 16-Sep-1990  Age: 32 y.o. MRN: [id number]  CC:  Chief Complaint  Patient presents with   Establish Care    HPI Samantha Singleton 32 year old female, presents to establish care. She  has a past medical history of Kidney infection, No significant past medical history, and Pregnant state, incidental.  Left breat discharge/pain The patient's primary symptoms include left breast yellow discharge. This is a recurrent problem, patient reports had 2 episodes in the last 6 months.The current episode was a month ago. Patient stated left breast pain has been gradually worsening and it comes and goes. Left breast discharge/pain affects the left side only Associated symptoms include nausea. Pertinent negatives include no abdominal pain, flank pain or vomiting. Breast discharge was yellow and milky. The symptoms are aggravated by activity and tight clothing. She has tried vaseline , OTC neosporin and dry guaze. The treatment provided mild relief. Patient reported is not sexually active for the past 2 years.     Outpatient Encounter Medications as of 07/19/2022  Medication Sig   clindamycin (CLEOCIN) 150 MG capsule Take 2 capsules (300 mg total) by mouth 4 (four) times daily.   cyclobenzaprine (FLEXERIL) 10 MG tablet Take 1 tablet (10 mg total) by mouth 2 (two) times daily as needed for muscle spasms.   HYDROcodone-acetaminophen (NORCO/VICODIN) 5-325 MG tablet Take one tab po q 4 hrs prn pain   ibuprofen (ADVIL,MOTRIN) 800 MG tablet Take 1 tablet (800 mg total) by mouth 3 (three) times daily.   promethazine-dextromethorphan (PROMETHAZINE-DM) 6.25-15 MG/5ML syrup Take 5 mLs by mouth 4 (four) times daily as needed.   No facility-administered encounter medications on file as of 07/19/2022.    Past Surgical History:  Procedure Laterality Date   DNC      Review of Systems  Constitutional:  Negative for chills and fever.   HENT:  Negative for hearing loss.   Respiratory:  Negative for shortness of breath.   Cardiovascular:  Negative for chest pain.  Gastrointestinal:  Negative for abdominal pain and nausea.  Genitourinary:  Negative for dysuria.  Musculoskeletal:  Negative for myalgias.  Neurological:  Negative for dizziness and headaches.      Objective    BP 108/75   Pulse 85   Ht 5\' 4"  (1.626 m)   Wt 197 lb (89.4 kg)   SpO2 98%   BMI 33.81 kg/m   Physical Exam HENT:     Head: Normocephalic.  Cardiovascular:     Rate and Rhythm: Normal rate and regular rhythm.     Pulses: Normal pulses.     Heart sounds: Normal heart sounds.  Pulmonary:     Effort: Pulmonary effort is normal.     Breath sounds: Normal breath sounds.  Abdominal:     General: Abdomen is flat.  Musculoskeletal:        General: Normal range of motion.     Cervical back: Normal range of motion.  Skin:    General: Skin is warm and dry.     Capillary Refill: Capillary refill takes less than 2 seconds.  Neurological:     General: No focal deficit present.     Mental Status: She is alert.  Psychiatric:        Mood and Affect: Mood normal.       Assessment & Plan:  Breast discharge Assessment & Plan: Hx of breast discharge in the past 6  months Left and right breast appear normal, no suspicious masses, no skin or nipple changes or axillary nodes. Ordered left breast ultrasound for further evaluation Advise patient to apply cold , wet cloth or ice pack to the affected breast , wear loose-fitting , cotton clothing, use fragrance-free lotions, soaps, and detergents to minimize irritation.   Orders: -     US BREAST COMPLETE UNI LEFT INC AXILLA; Future  Hyperlipidemia, unspecified hyperlipidemia type -     CBC with Differential/Platelet -     CMP14+EGFR -     Lipid panel  IFG (impaired fasting glucose) -     Hemoglobin A1c  Hypothyroidism, unspecified type -     TSH + free T4  Screening for HIV (human  immunodeficiency virus) -     HIV Antibody (routine testing w rflx)  Need for hepatitis C screening test -     Hepatitis C antibody    No follow-ups on file.   Renard Hamper Ria Comment, FNP

## 2022-07-20 LAB — CBC WITH DIFFERENTIAL/PLATELET
Basophils Absolute: 0 10*3/uL (ref 0.0–0.2)
Basos: 1 %
EOS (ABSOLUTE): 0.2 10*3/uL (ref 0.0–0.4)
Eos: 3 %
Hematocrit: 38.1 % (ref 34.0–46.6)
Hemoglobin: 12.3 g/dL (ref 11.1–15.9)
Immature Grans (Abs): 0 10*3/uL (ref 0.0–0.1)
Immature Granulocytes: 0 %
Lymphocytes Absolute: 2.6 10*3/uL (ref 0.7–3.1)
Lymphs: 33 %
MCH: 28.1 pg (ref 26.6–33.0)
MCHC: 32.3 g/dL (ref 31.5–35.7)
MCV: 87 fL (ref 79–97)
Monocytes Absolute: 0.4 10*3/uL (ref 0.1–0.9)
Monocytes: 5 %
Neutrophils Absolute: 4.6 10*3/uL (ref 1.4–7.0)
Neutrophils: 58 %
Platelets: 311 10*3/uL (ref 150–450)
RBC: 4.38 x10E6/uL (ref 3.77–5.28)
RDW: 13.1 % (ref 11.7–15.4)
WBC: 7.9 10*3/uL (ref 3.4–10.8)

## 2022-07-20 LAB — CMP14+EGFR
ALT: 10 IU/L (ref 0–32)
AST: 12 IU/L (ref 0–40)
Albumin/Globulin Ratio: 1.8 (ref 1.2–2.2)
Albumin: 4.6 g/dL (ref 4.0–5.0)
Alkaline Phosphatase: 128 IU/L — ABNORMAL HIGH (ref 44–121)
BUN/Creatinine Ratio: 9 (ref 9–23)
BUN: 6 mg/dL (ref 6–20)
Bilirubin Total: 0.2 mg/dL (ref 0.0–1.2)
CO2: 20 mmol/L (ref 20–29)
Calcium: 9.7 mg/dL (ref 8.7–10.2)
Chloride: 103 mmol/L (ref 96–106)
Creatinine, Ser: 0.66 mg/dL (ref 0.57–1.00)
Globulin, Total: 2.6 g/dL (ref 1.5–4.5)
Glucose: 99 mg/dL (ref 70–99)
Potassium: 4.7 mmol/L (ref 3.5–5.2)
Sodium: 137 mmol/L (ref 134–144)
Total Protein: 7.2 g/dL (ref 6.0–8.5)
eGFR: 121 mL/min/{1.73_m2} (ref >59)

## 2022-07-20 LAB — LIPID PANEL
Chol/HDL Ratio: 3.5 ratio (ref 0.0–4.4)
Cholesterol, Total: 204 mg/dL — ABNORMAL HIGH (ref 100–199)
HDL: 58 mg/dL (ref >39)
LDL Chol Calc (NIH): 134 mg/dL — ABNORMAL HIGH (ref 0–99)
Triglycerides: 66 mg/dL (ref 0–149)
VLDL Cholesterol Cal: 12 mg/dL (ref 5–40)

## 2022-07-20 LAB — HEMOGLOBIN A1C
Est. average glucose Bld gHb Est-mCnc: 114 mg/dL
Hgb A1c MFr Bld: 5.6 % (ref 4.8–5.6)

## 2022-07-20 LAB — HIV ANTIBODY (ROUTINE TESTING W REFLEX): HIV Screen 4th Generation wRfx: NONREACTIVE

## 2022-07-20 LAB — HEPATITIS C ANTIBODY: Hep C Virus Ab: NONREACTIVE

## 2022-07-20 LAB — TSH+FREE T4
Free T4: 1.32 ng/dL (ref 0.82–1.77)
TSH: 1.01 u[IU]/mL (ref 0.450–4.500)

## 2022-07-27 NOTE — Progress Notes (Signed)
Erroneous encounter - please disregard.

## 2022-08-09 ENCOUNTER — Ambulatory Visit (HOSPITAL_COMMUNITY)
Admission: RE | Admit: 2022-08-09 | Discharge: 2022-08-09 | Disposition: A | Discharge status: Home / Self Care | Payer: Medicaid Other | Source: Ambulatory Visit | Attending: Family Medicine | Admitting: Family Medicine

## 2022-08-09 ENCOUNTER — Other Ambulatory Visit: Payer: Self-pay | Admitting: Family Medicine

## 2022-08-09 DIAGNOSIS — N6452 Nipple discharge: Secondary | ICD-10-CM | POA: Insufficient documentation

## 2022-08-09 DIAGNOSIS — N644 Mastodynia: Secondary | ICD-10-CM | POA: Insufficient documentation

## 2022-08-12 ENCOUNTER — Other Ambulatory Visit: Payer: Self-pay | Admitting: Family Medicine

## 2022-08-12 DIAGNOSIS — N611 Abscess of the breast and nipple: Secondary | ICD-10-CM

## 2022-08-13 ENCOUNTER — Other Ambulatory Visit: Payer: Self-pay | Admitting: Family Medicine

## 2022-08-13 DIAGNOSIS — N6452 Nipple discharge: Secondary | ICD-10-CM

## 2022-08-17 ENCOUNTER — Other Ambulatory Visit: Payer: Self-pay | Admitting: Family Medicine

## 2022-08-30 ENCOUNTER — Ambulatory Visit (HOSPITAL_COMMUNITY)
Admission: RE | Admit: 2022-08-30 | Discharge: 2022-08-30 | Disposition: A | Discharge status: Home / Self Care | Payer: Medicaid Other | Source: Ambulatory Visit | Attending: Family Medicine | Admitting: Family Medicine

## 2022-08-30 DIAGNOSIS — N6452 Nipple discharge: Secondary | ICD-10-CM | POA: Diagnosis not present

## 2022-08-30 MED ORDER — GADOBUTROL 1 MMOL/ML IV SOLN
9.0000 mL | Freq: Once | INTRAVENOUS | Status: AC | PRN
  Administered 2022-08-30: 9 mL via INTRAVENOUS

## 2022-08-31 ENCOUNTER — Telehealth: Payer: Self-pay | Admitting: Family Medicine

## 2022-08-31 ENCOUNTER — Other Ambulatory Visit: Payer: Self-pay | Admitting: Family Medicine

## 2022-08-31 DIAGNOSIS — N6321 Unspecified lump in the left breast, upper outer quadrant: Secondary | ICD-10-CM

## 2022-08-31 NOTE — Telephone Encounter (Signed)
Diane called from [facility] radiology MRI breast call report Call back # [phone]

## 2022-09-04 ENCOUNTER — Ambulatory Visit (HOSPITAL_COMMUNITY)
Admission: RE | Admit: 2022-09-04 | Discharge: 2022-09-04 | Disposition: A | Discharge status: Home / Self Care | Payer: Medicaid Other | Source: Ambulatory Visit | Attending: Family Medicine | Admitting: Family Medicine

## 2022-09-04 ENCOUNTER — Ambulatory Visit (HOSPITAL_COMMUNITY): Payer: Medicaid Other

## 2022-09-04 ENCOUNTER — Other Ambulatory Visit: Payer: Self-pay | Admitting: Family Medicine

## 2022-09-04 ENCOUNTER — Other Ambulatory Visit (HOSPITAL_COMMUNITY): Payer: Self-pay | Admitting: Family Medicine

## 2022-09-04 DIAGNOSIS — N6320 Unspecified lump in the left breast, unspecified quadrant: Secondary | ICD-10-CM

## 2022-09-04 DIAGNOSIS — N6321 Unspecified lump in the left breast, upper outer quadrant: Secondary | ICD-10-CM | POA: Diagnosis not present

## 2022-09-19 ENCOUNTER — Other Ambulatory Visit (HOSPITAL_COMMUNITY): Payer: Self-pay | Admitting: Family Medicine

## 2022-09-19 DIAGNOSIS — N6320 Unspecified lump in the left breast, unspecified quadrant: Secondary | ICD-10-CM

## 2022-09-20 ENCOUNTER — Ambulatory Visit (HOSPITAL_COMMUNITY)
Admission: RE | Admit: 2022-09-20 | Discharge: 2022-09-20 | Disposition: A | Discharge status: Home / Self Care | Payer: Medicaid Other | Source: Ambulatory Visit | Attending: Family Medicine | Admitting: Family Medicine

## 2022-09-20 ENCOUNTER — Encounter (HOSPITAL_COMMUNITY): Payer: Self-pay

## 2022-09-20 DIAGNOSIS — N6320 Unspecified lump in the left breast, unspecified quadrant: Secondary | ICD-10-CM

## 2022-09-20 DIAGNOSIS — N6092 Unspecified benign mammary dysplasia of left breast: Secondary | ICD-10-CM | POA: Diagnosis not present

## 2022-09-20 DIAGNOSIS — D242 Benign neoplasm of left breast: Secondary | ICD-10-CM | POA: Diagnosis not present

## 2022-09-20 DIAGNOSIS — N6321 Unspecified lump in the left breast, upper outer quadrant: Secondary | ICD-10-CM | POA: Diagnosis not present

## 2022-09-20 HISTORY — PX: BREAST BIOPSY: SHX20

## 2022-09-20 MED ORDER — LIDOCAINE HCL (PF) 2 % IJ SOLN
INTRAMUSCULAR | Status: AC
  Filled 2022-09-20: qty 10

## 2022-09-20 MED ORDER — LIDOCAINE-EPINEPHRINE (PF) 1 %-1:200000 IJ SOLN
INTRAMUSCULAR | Status: AC
  Filled 2022-09-20: qty 30

## 2022-09-21 LAB — SURGICAL PATHOLOGY

## 2022-10-04 ENCOUNTER — Encounter: Payer: Self-pay | Admitting: General Surgery

## 2022-10-04 ENCOUNTER — Ambulatory Visit: Payer: Medicaid Other | Admitting: General Surgery

## 2022-10-04 VITALS — BP 110/75 | HR 92 | Temp 98.3°F | Resp 14 | Ht 64.0 in | Wt 199.0 lb

## 2022-10-04 DIAGNOSIS — N6082 Other benign mammary dysplasias of left breast: Secondary | ICD-10-CM | POA: Diagnosis not present

## 2022-10-04 NOTE — Progress Notes (Signed)
[facility] History and Physical  Reason for Referral: Left upper outer breast fibroadenoma, left breast 6 oclock Benign intradermal inclusion cyst in the LEFT breast, benign right breast nipple discharge  Referring Physician: Rica Records, FNP   Chief Complaint   New Patient (Initial Visit)     Samantha Singleton is a 32 y.o. female.  HPI: Samantha Singleton is a 32 yo who had some drainage and swelling from an area at the 6 oclokc position of her left breast several times in the past few years. Samantha Singleton brought this up to her PCP in addition to her having some yellow nipple discharge and reported history of nipple piercing. Samantha Singleton did not desribe any bloody or brown discharge to me. Samantha Singleton was worked up by her PCP with initially and [place] and Mammogram.   This demonstrated a a intradermal inclusion cyst in the area of interest and given her nipple discharge radiology recommended a MRI.  Samantha Singleton had the MRI that found nothing on the right of concern and did find an area in the 1 o'clock position of the left breast that required biopsy. 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  The patient was referred to surgery during hte middle of this workup, and her main concern is the area on the 6 o'clock position of the left breast that has been inflamed and drained in the past consistent with an infected cyst given the imaging findings.   Past Medical History:  Diagnosis Date   Kidney infection    No significant past medical history    Pregnant state, incidental     Past Surgical History:  Procedure Laterality Date   BREAST BIOPSY Left 09/20/2022   BREAST BIOPSY Left 09/20/2022   [place] LT BREAST BX W LOC DEV 1ST LESION IMG BX SPEC US GUIDE 09/20/2022 Edwin Cap, MD AP-ULTRASOUND   [facility]      Family History  Family history unknown: Yes    Social History   Tobacco Use   Smoking status: Never   Smokeless tobacco: Never  Vaping  Use   Vaping Use: Every day  Substance Use Topics   Alcohol use: No   Drug use: No    Medications: I have reviewed the patient's current medications. Allergies as of 10/04/2022   No Known Allergies      Medication List        Accurate as of October 04, 2022 11:59 PM. If you have any questions, ask your nurse or doctor.          STOP taking these medications    clindamycin 150 MG capsule Commonly known as: CLEOCIN Stopped by: Lucretia Roers, MD   cyclobenzaprine 10 MG tablet Commonly known as: FLEXERIL Stopped by: Lucretia Roers, MD   HYDROcodone-acetaminophen 5-325 MG tablet Commonly known as: NORCO/VICODIN Stopped by: Lucretia Roers, MD   ibuprofen 800 MG tablet Commonly known as: ADVIL Stopped by: Lucretia Roers, MD   promethazine-dextromethorphan 6.25-15 MG/5ML syrup Commonly known as: PROMETHAZINE-DM Stopped by: Lucretia Roers, MD         ROS:  A comprehensive review of systems was negative except for: Integument/breast: positive for nipple discharge and left breast draining area 6 o'clock  Blood pressure 110/75, pulse 92, temperature 98.3 F (36.8 C), temperature source Oral, resp. rate 14, height 5\' 4"  (1.626 m), weight 199 lb (90.3 kg), last menstrual period 09/06/2022, SpO2 97 %, unknown if currently breastfeeding.  Physical Exam Vitals reviewed.  HENT:     Head: Normocephalic.     Nose: Nose normal.  Eyes:     Extraocular Movements: Extraocular movements intact.  Cardiovascular:     Rate and Rhythm: Normal rate and regular rhythm.  Pulmonary:     Effort: Pulmonary effort is normal.     Breath sounds: Normal breath sounds.  Chest:  Breasts:    Right: No inverted nipple, mass or skin change.     Left: Mass present. No inverted nipple or skin change.     Comments: Small dermal mass at the 6 o'clock position on left breast, no erythema or drainage present Abdominal:     General: There is no distension.     Palpations: Abdomen is  soft.     Tenderness: There is no abdominal tenderness.  Musculoskeletal:        General: Normal range of motion.     Cervical back: Normal range of motion.  Lymphadenopathy:     Upper Body:     Right upper body: No axillary adenopathy.     Left upper body: No axillary adenopathy.  Skin:    General: Skin is warm.  Neurological:     General: No focal deficit present.     Mental Status: Samantha Singleton is alert and oriented to person, place, and time.  Psychiatric:        Mood and Affect: Mood normal.        Behavior: Behavior normal.        Thought Content: Thought content normal.        Judgment: Judgment normal.     Results:  CLINICAL DATA:  Patient describes a mass in the LOWER LEFT breast. On 2 occasions, mass has enlarged, gotten red, and drained pus. In the RIGHT breast, Samantha Singleton has noted intermittent spontaneous nipple discharge, usually yellow in color. Remote history of bilateral nipple piercings. Unknown family history.   EXAM: DIGITAL DIAGNOSTIC BILATERAL MAMMOGRAM WITH TOMOSYNTHESIS; ULTRASOUND LEFT BREAST LIMITED; ULTRASOUND RIGHT BREAST LIMITED   TECHNIQUE: Bilateral digital diagnostic mammography and breast tomosynthesis was performed.; Targeted ultrasound examination of the left breast was performed.; Targeted ultrasound examination of the right breast was performed   COMPARISON:  None available.   ACR Breast Density Category b: There are scattered areas of fibroglandular density.   FINDINGS: RIGHT BREAST:   Mammogram: No suspicious mass, distortion, or microcalcifications are identified to suggest presence of malignancy. Spot tangential view of the retroareolar region of the RIGHT breast is unremarkable. Mammographic images were processed with CAD.   Ultrasound: Targeted ultrasound is performed, showing normal appearing fibroglandular tissue in the retroareolar region of the RIGHT breast.   LEFT BREAST:   Mammogram: No suspicious mass, distortion, or  microcalcifications are identified to suggest presence of malignancy. Spot tangential view is performed in the area concern in the LOWER LEFT breast. This view shows normal appearing fibrofatty tissue. Mammographic images were processed with CAD.   Physical Exam: There is a well-healed non erythematous papule in the 6 o'clock location of the LEFT breast corresponding to the area of concern. On physical exam, there is a visible punctum associated with this papule. Patient has a photo on her phone, showing this area was erythematous and edematous, with central draining purulent fluid.   Ultrasound: Targeted ultrasound is performed, showing a small hypoechoic collection within the skin in the 6 o'clock location of the LEFT breast corresponding to the area of concern, measuring 0.5 x 0.1 x 0.4 centimeters. Findings are  consistent with benign intradermal inclusion cyst.   IMPRESSION: No mammographic evidence for malignancy.   Benign intradermal inclusion cyst in the LEFT breast.   Spontaneous RIGHT nipple discharge warrants further evaluation.   RECOMMENDATION: Recommend surgical consultation and bilateral breast MRI to evaluate spontaneous RIGHT nipple discharge.   I have discussed the findings and recommendations with the patient. If applicable, a reminder letter will be sent to the patient regarding the next appointment.   BI-RADS CATEGORY  2: Benign.     Electronically Signed   By: Norva Pavlov M.D.   On: 08/09/2022 12:16     CLINICAL DATA:  32 year old female with recent breast MRI 08/30/2022 which was performed for spontaneous right nipple discharge demonstrating a small bilobed mass versus 2 enhancing masses in the upper-outer left breast measuring 0.9 cm. No abnormalities were identified in the right breast. Further evaluation/characterization with targeted ultrasound was recommended.   EXAM: ULTRASOUND OF THE LEFT BREAST   COMPARISON:  Previous exams.    FINDINGS: Targeted ultrasound of the entire upper outer left breast was performed. There is a mass versus cluster of masses in the left breast at 1 o'clock 4 cm from nipple measuring 0.8 x 0.4 x 0.7 cm. This is felt to correspond well with the enhancing mass seen in the upper-outer left breast on recent MRI. This may represent a cluster of complicated cysts although cannot be clearly characterized as such. No lymphadenopathy seen in the left axilla.   IMPRESSION: Indeterminate 0.8 cm mass in the left breast at the 1 o'clock position possibly a cluster of complicated cysts. This is felt to correspond with the small enhancing mass seen in the upper-outer left breast on recent MRI.   RECOMMENDATION: Recommend ultrasound-guided core biopsy of the mass in the left breast at the 1 o'clock position.   I have discussed the findings and recommendations with the patient. If applicable, a reminder letter will be sent to the patient regarding the next appointment.   BI-RADS CATEGORY  4: Suspicious.     Electronically Signed   By: Edwin Cap M.D.   On: 09/04/2022 15:56  CLINICAL DATA:  Spontaneous right nipple discharge. Patient presented describing a mass in the lower left breast that on 2 occasions became larger, red and drained pus. Spontaneous nipple discharge reported on the right. History of bilateral nipple piercings. Patient underwent diagnostic mammography and ultrasound for these indications on 08/09/2022. Exams were negative other than an epidermal inclusion cyst on the left, 5 mm in size, corresponding to the area of left breast concern.   EXAM: BILATERAL BREAST MRI WITH AND WITHOUT CONTRAST   TECHNIQUE: Multiplanar, multisequence MR images of both breasts were obtained prior to and following the intravenous administration of 9 ml of Gadavist   Three-dimensional MR images were rendered by post-processing of the original MR data on an independent workstation.  The three-dimensional MR images were interpreted, and findings are reported in the following complete MRI report for this study. Three dimensional images were evaluated at the independent interpreting workstation using the DynaCAD thin client.   COMPARISON:  01/09/2023.   FINDINGS: Breast composition: b. Scattered fibroglandular tissue.   Background parenchymal enhancement: Mild   Right breast: No mass or abnormal enhancement.   Left breast: There is a small bilobed mass versus 2 contiguous enhancing masses in the upper outer left breast measuring 9 x 4 x 7 mm, centered on image 51, series 1056. This shows mild ill-defined T2 signal hyperintensity and is of decreased signal  on T1 weighted imaging.   No other left breast masses or areas of abnormal enhancement.   Small T2 signal hyperintense lesion projects within the skin of the inferior breast consistent with the epidermal inclusion cyst noted on the prior ultrasound.   Lymph nodes: No abnormal appearing lymph nodes.   Ancillary findings:  None.   IMPRESSION: 1. Indeterminate, 9 mm, enhancing bilobed mass versus 2 contiguous masses in the upper outer left breast. 2. No other breast masses or areas of abnormal enhancement. No MRI findings to account for right-sided nipple discharge.   RECOMMENDATION: 1. Targeted ultrasound of the left breast to attempt to identify the 9 mm enhancing mass noted in the upper outer quadrant on MRI. If this can be visualized sonographically, then follow-up management well be based on the sonographic appearance. If it cannot be identified sonographically, recommend MRI guided core needle biopsy.   BI-RADS CATEGORY  4: Suspicious.     Electronically Signed   By: Amie Portland M.D.   On: 08/30/2022 14:19    CLINICAL DATA:  Patient presents for ultrasound-guided core biopsy of a mass in the left breast the 1 o'clock position.   EXAM: ULTRASOUND GUIDED LEFT BREAST CORE NEEDLE BIOPSY    COMPARISON:  Previous exam(s).   PROCEDURE: I met with the patient and we discussed the procedure of ultrasound-guided biopsy, including benefits and alternatives. We discussed the high likelihood of a successful procedure. We discussed the risks of the procedure, including infection, bleeding, tissue injury, clip migration, and inadequate sampling. Informed written consent was given. The usual time-out protocol was performed immediately prior to the procedure.   Lesion quadrant: Upper outer   Using sterile technique and 1% Lidocaine as local anesthetic, under direct ultrasound visualization, a 14 gauge spring-loaded device was used to perform biopsy of the mass in the left breast at the 1 o'clock position using a lateral to medial approach. At the conclusion of the procedure a ribbon shaped tissue marker clip was deployed into the biopsy cavity. Follow up 2 view mammogram was performed and dictated separately.   IMPRESSION: Ultrasound guided biopsy of the mass in the left breast at the 1 o'clock position. No apparent complications.   Electronically Signed: By: Edwin Cap M.D. 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 Negative for malignancy.   Pathology results are CONCORDANT with imaging findings, per Dr. Edwin Cap.   Pathology results and recommendations were discussed with patient via telephone on 09/21/22. Patient reported swelling and bruising at biopsy site with severe pain, pt. Was given phone number to [facility] Mammography Department. Post biopsy care instructions were reviewed, questions were answered and my direct phone number was provided. Patient was instructed to call [facility] Mammography Department for any additional questions or concerns related to biopsy site.    RECOMMENDATIONS: The patient was instructed to continue with monthly self breast examinations, clinical follow-up as needed, and to return for annual mammography at 41, unless instructed otherwise.   Pathology results reported by Hezzie Bump, RN 09/21/2022.     Electronically Signed   By: Edwin Cap M.D.   On: 09/25/2022 07:34    Assessment & Plan:  Luisanna Schielke is a 32 y.o. female with benign findings on breast imaging and a intradermal cyst that has been inflamed and drained 2-3 times this past year.  -Discussed excision of that  intradermal inclusion cyst at 6 o'clock on the left breast under local and risk of bleeding, infection, recurrence, discussed doing this after the summer due to scheduling issues with her children and events planned  -Discussed the yellow nipple discharge that is physiologic and showed stop on its own and benign findings on MRI  -Discussed benign fibroadenoma in the 1 o'clock position of the left breast and no need for further workup or excision   Future Appointments  Date Time Provider Department Center  01/24/2023  3:00 PM Lucretia Roers, MD [facility] None    All questions were answered to the satisfaction of the patient.   Lucretia Roers 10/05/2022, 9:41 AM

## 2022-10-04 NOTE — Patient Instructions (Signed)
Epidermoid Cyst Removal Epidermoid cyst removal is a procedure to remove a fluid-filled sac that forms under your skin (epidermoid cyst). This type of cyst is filled with a thick, oily substance (keratin) that is secreted by your skin glands. Epidermoid cysts may also be called epidermal cysts, or keratin cysts. Normally, the skin secretes this pasty material through a gland or a hair follicle. However, when a skin gland or hair follicle becomes blocked, an epidermoid cyst can form. You may need this procedure if you have an epidermal cyst that becomes large, uncomfortable, or inflamed. Tell a health care provider about: Any allergies you have. All medicines you are taking, including vitamins, herbs, eye drops, creams, and over-the-counter medicines. Any problems you or family members have had with anesthetic medicines. Any blood disorders you have. Any surgeries you have had. Any medical conditions you have now or have had. Whether you are pregnant or may be pregnant. What are the risks? Generally, this is a safe procedure. However, problems may occur, including: Recurrence of the cyst. Bleeding. Infection. Scarring. What happens before the procedure? Ask your health care provider about: Changing or stopping your regular medicines. This is especially important if you are taking diabetes medicines or blood thinners. Taking medicines such as aspirin and ibuprofen. These medicines can thin your blood. Do not take these medicines unless your health care provider tells you to take them. Taking over-the-counter medicines, vitamins, herbs, and supplements. If you have an inflamed or infected cyst, you may have to take antibiotic medicine before the cyst removal. Take your antibiotic as told by your health care provider. Do not stop taking the antibiotic even if you start to feel better. Take a shower on the morning of your procedure. Your health care provider may ask you to use a germ-killing  soap. What happens during the procedure?  You will be given a medicine to numb the area (local anesthetic). The skin around the cyst will be cleaned with a germ-killing solution. The health care provider will make a small incision in your skin over the cyst. The health care provider will separate the cyst from the surrounding tissues that are under your skin. If possible, the cyst will be removed undamaged (intact). If the cyst bursts (ruptures), it will be removed in pieces. After the cyst is removed, the health care provider will control any bleeding and close the incision with small stitches (sutures). Small incisions may not need sutures, and the bleeding will be controlled by applying direct pressure with gauze. The health care provider may apply antibiotic ointment and a bandage (dressing) over the incision. The procedure may vary among health care providers and hospitals. What happens after the procedure? If you are prescribed an antibiotic medicine or ointment, take or apply it as told by your health care provider. Do not stop using the antibiotic even if you start to feel better. Summary Epidermoid cyst removal is a procedure to remove a sac that has formed under your skin. You may need this procedure if you have an epidermoid cyst that becomes large, uncomfortable, or inflamed. The health care provider will make a small incision in your skin to remove the cyst. If you are prescribed an antibiotic medicine before the procedure, after the procedure, or both, use the antibiotic as told by your health care provider. Do not stop using the antibiotic even if you start to feel better. This information is not intended to replace advice given to you by your health care provider. Make sure  you discuss any questions you have with your health care provider. Document Revised: 07/22/2019 Document Reviewed: 07/22/2019 Elsevier Patient Education  2024 [employer].

## 2022-10-05 ENCOUNTER — Encounter: Payer: Self-pay | Admitting: General Surgery

## 2022-10-05 DIAGNOSIS — N6082 Other benign mammary dysplasias of left breast: Secondary | ICD-10-CM | POA: Insufficient documentation

## 2022-11-23 ENCOUNTER — Emergency Department (HOSPITAL_COMMUNITY)
Admission: EM | Admit: 2022-11-23 | Discharge: 2022-11-23 | Disposition: A | Discharge status: Home / Self Care | Payer: Medicaid Other | Attending: Emergency Medicine | Admitting: Emergency Medicine

## 2022-11-23 ENCOUNTER — Encounter (HOSPITAL_COMMUNITY): Payer: Self-pay

## 2022-11-23 ENCOUNTER — Other Ambulatory Visit: Payer: Self-pay

## 2022-11-23 DIAGNOSIS — L02214 Cutaneous abscess of groin: Secondary | ICD-10-CM | POA: Diagnosis not present

## 2022-11-23 MED ORDER — DOXYCYCLINE HYCLATE 100 MG PO CAPS: 100.0000 mg | ORAL_CAPSULE | Freq: Two times a day (BID) | ORAL | 0 refills | Status: DC

## 2022-11-23 MED ORDER — DOXYCYCLINE HYCLATE 100 MG PO TABS
100.0000 mg | ORAL_TABLET | Freq: Once | ORAL | Status: AC
  Administered 2022-11-23: 100 mg via ORAL
  Filled 2022-11-23: qty 1

## 2022-11-23 NOTE — ED Triage Notes (Signed)
Pt c/o vaginal pain and swelling. States that she is on her period and that it started Monday. Pt reporting no abnormal bleeding or itching. States that her is burning and swollen "from lips to rectal area."

## 2022-11-23 NOTE — ED Provider Notes (Signed)
  [place] EMERGENCY DEPARTMENT AT [facility] Provider Note   CSN: [id number] Arrival date & time: 11/23/22  0132     History  No chief complaint on file.   Samantha Singleton is a 32 y.o. female.  Patient is a 46-year-old female with no significant past medical history.  Patient presenting today with complaints of pain to the medial aspect of the inside of her upper right thigh.  This has been worsening over the past several days.  No fevers or chills.  No vaginal discharge.  No new sexual partners.  The history is provided by the patient.       Home Medications Prior to Admission medications   Not on File      Allergies    Patient has no known allergies.    Review of Systems   Review of Systems  All other systems reviewed and are negative.   Physical Exam Updated Vital Signs Pulse (!) 120   Resp 18   SpO2 94%  Physical Exam Vitals and nursing note reviewed.  Constitutional:      Appearance: Normal appearance.  Pulmonary:     Effort: Pulmonary effort is normal.  Skin:    General: Skin is warm and dry.     Comments: To the medial aspect of the right upper thigh/groin, there is a 3 cm x 2 cm, fluctuant, indurated, erythematous area with a central pustule.  Neurological:     Mental Status: She is alert and oriented to person, place, and time.     ED Results / Procedures / Treatments   Labs (all labs ordered are listed, but only abnormal results are displayed) Labs Reviewed - No data to display  EKG None  Radiology No results found.  Procedures Procedures    Medications Ordered in ED Medications  doxycycline (VIBRA-TABS) tablet 100 mg (has no administration in time range)    ED Course/ Medical Decision Making/ A&P  Patient presenting with a boil/abscess to the inside of her right groin.  It appears as though it is on the cusp of draining.  The area is exquisitely tender and patient has agreed to try antibiotics, sitz bath, warm compresses,  and follow-up as needed.  Final Clinical Impression(s) / ED Diagnoses Final diagnoses:  None    Rx / DC Orders ED Discharge Orders     None         Geoffery Lyons, MD 11/23/22 [phone]

## 2022-11-23 NOTE — Discharge Instructions (Signed)
Begin taking doxycycline as prescribed.  Apply warm compresses or perform sitz bath's as frequently as possible for the next several days.  Follow-up with your primary doctor if symptoms are not improving in the next few days and return to the ER if symptoms significantly worsen or change.Marland Kitchen

## 2023-01-24 ENCOUNTER — Ambulatory Visit: Payer: Medicaid Other | Admitting: General Surgery

## 2023-01-24 ENCOUNTER — Encounter: Payer: Self-pay | Admitting: General Surgery

## 2023-01-24 VITALS — BP 106/73 | HR 72 | Temp 98.2°F | Resp 14 | Ht 64.0 in | Wt 188.0 lb

## 2023-01-24 DIAGNOSIS — N61 Mastitis without abscess: Secondary | ICD-10-CM | POA: Diagnosis not present

## 2023-01-24 DIAGNOSIS — N6082 Other benign mammary dysplasias of left breast: Secondary | ICD-10-CM

## 2023-01-24 DIAGNOSIS — L720 Epidermal cyst: Secondary | ICD-10-CM | POA: Diagnosis not present

## 2023-01-24 DIAGNOSIS — L905 Scar conditions and fibrosis of skin: Secondary | ICD-10-CM | POA: Diagnosis not present

## 2023-01-24 MED ORDER — DOXYCYCLINE HYCLATE 50 MG PO CAPS: 50.0000 mg | ORAL_CAPSULE | Freq: Two times a day (BID) | ORAL | 0 refills | Status: AC

## 2023-01-24 MED ORDER — OXYCODONE HCL 5 MG PO TABS: 5.0000 mg | ORAL_TABLET | ORAL | 0 refills | Status: DC | PRN

## 2023-01-24 NOTE — Patient Instructions (Signed)
Discharge Instructions:  Common Complaints: Pain at the incision site is common.  Bruising is common.  Take the antibiotic for the redness on the right breast.   Diet/ Activity: Shower per your regular routine daily.  Do not take hot showers. Take warm showers that are less than 10 minutes. Hot long showers can cause the glue to peel.  Do not pick at the dermabond glue on your incision sites. This glue film will remain in place for 1-2 weeks and will start to peel off.  Do not place lotions or balms on your incision unless instructed to specifically by Dr. Henreitta Leber.   Medication: Take tylenol and ibuprofen as needed for pain control, alternating every 4-6 hours.  Example:  Tylenol 1000mg  @ 6am, 12noon, 6pm, (Do not exceed 4000mg  of tylenol a day). Ibuprofen 800mg  @ 9am, 3pm, 9pm, 3am (Do not exceed 3600mg  of ibuprofen a day).  Take Roxicodone for breakthrough pain every 4 hours.  Take Colace for constipation related to narcotic pain medication. If you do not have a bowel movement in 2 days, take Miralax over the counter.  Drink plenty of water to also prevent constipation.   Contact Information: If you have questions or concerns, please call our office, [phone], Monday- Thursday 8AM-5PM and Friday 8AM-12Noon.  If it is after hours or on the weekend, please call [facility] Main Number, [phone], [phone] and ask to speak to the surgeon on call for Dr. Henreitta Leber at [facility].

## 2023-01-24 NOTE — Progress Notes (Signed)
[facility] Procedure Note  01/24/23  Pre-procedure Diagnosis: Left breast epidermal inclusion cyst    Post-procedure Diagnosis: Same   Procedure(s) Performed: Excision of left breast epidermal inclusion cyst    Surgeon: Leatrice Jewels. Henreitta Leber, MD   Assistants: No qualified resident was available    Anesthesia: Lidocaine 1%    Specimens: Left breast cyst    Estimated Blood Loss: Minimal  Wound Class: Clean    Procedure Indications: Ms. Fraioli is a 32 yo who has a left breast epidermal inclusion cyst who has had infection in the past. We discussed excision in the spring and risk of bleeding, infection, recurrence.   Findings: Normal epidermal inclusion cyst    Procedure: The patient was taken to the procedure room and placed on her back. The left was prepared and draped in the usual sterile fashion. Lidocaine 1% was injected in the area around the cyst. An elliptical incision was made around the aera on the skin where the cyst was located. This was excised in its entirety. Hemostasis was achieved. The skin was then closed with a running 4-0 Monocryl and dermabond.   Final inspection revealed acceptable hemostasis. The patient tolerated the procedure well.   Patient with right breast cellulitis at 6 o'clock. All of her prior imaging with no cyst or mass on the right breast noted.  Doxcycline sent in for the right breast cellulitis. Would monitor for now and if recurs can consider excision if possible another cyst but doubtful given lack of area on imaging in the past.   Algis Greenhouse, MD [facility] [address] [place], [place] [address] [phone] (office)

## 2023-01-25 ENCOUNTER — Other Ambulatory Visit: Payer: Self-pay | Admitting: Family Medicine

## 2023-01-25 DIAGNOSIS — N6082 Other benign mammary dysplasias of left breast: Secondary | ICD-10-CM

## 2023-01-30 LAB — PATHOLOGY REPORT

## 2023-01-30 LAB — TISSUE SPECIMEN

## 2023-01-30 NOTE — Progress Notes (Signed)
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

## 2023-02-01 ENCOUNTER — Telehealth: Payer: Self-pay | Admitting: *Deleted

## 2023-02-01 NOTE — Telephone Encounter (Signed)
Surgical Date: 01/24/2023 Procedure: Excision Cyst, Left Breast  Patient called after hours on 01/31/2023 and reached Dr. Henreitta Leber. Reported that incision to left breast opened. Dr. Henreitta Leber advised patient to come to office for evaluation by nursing staff on 02/01/2023.  Patient came to office and Clinical research associate evaluated area. Noted incision to 6 o'clock area of left breast opened. oted glue peeled up around opening. Noted dark discharge that appeared to be dried blood.   Removed Dermabond from skin with Neosporin. Cleansed area of old dried blood to examine area better.   Approximately 2.5 cm long horizontally and 1cm long vertically at the largest part. Wound bed noted WNL and granulation tissue noted. Very shallow wound depth from skin. Noted glue peeled up around opening. Noted that skin over wound darkened and loose. Able to place end of Q- Tip under skin flap. Advised that skin flap would most likely slough off during healing. Advised to cover with Neosporin and bandage.   Small pustule and slight redness/ irritation noted to skin under breast where adhesive located from the bottom of bandage.   Patient also requested right breast cellulitis to be examined. No redness or warmth to touch noted. Slight lump noted under skin at 6 o'clock position about the size of 0.5cm in diameter.   Doxycycline was prescribed on 01/24/2023 x7 days. Patient has completed course.   Patient requested F/U appointment with Dr. Henreitta Leber next week. Appointment scheduled.

## 2023-02-07 ENCOUNTER — Encounter: Payer: Self-pay | Admitting: General Surgery

## 2023-02-07 ENCOUNTER — Ambulatory Visit: Payer: Medicaid Other | Admitting: General Surgery

## 2023-02-07 VITALS — BP 104/72 | HR 72 | Temp 98.1°F | Resp 16 | Ht 64.0 in | Wt 191.0 lb

## 2023-02-07 DIAGNOSIS — T8130XA Disruption of wound, unspecified, initial encounter: Secondary | ICD-10-CM

## 2023-02-08 NOTE — Progress Notes (Signed)
[facility]  Patient with superficial dehiscence of left breast excision site. Doing fair. Healing.  BP 104/72   Pulse 72   Temp 98.1 F (36.7 C) (Oral)   Resp 16   Ht 5\' 4"  (1.626 m)   Wt 191 lb (86.6 kg)   SpO2 98%   BMI 32.79 kg/m  Granulating, superficial  Patient s/p left excision of cyst from left breast skin. Dehiscence superficial.   PRN follow up  Continue dressing on area, neosporin  Call with issues   Algis Greenhouse, MD [facility] [address] [place], [place] [address] [phone] (office) '

## 2023-02-08 NOTE — Patient Instructions (Signed)
Continue dressing on area, neosporin  Call with issues
